# Patient Record
Sex: Female | Born: 1985
Health system: Southern US, Community
[De-identification: ages and names within clinical notes are randomized; demographics above are authoritative.]

## PROBLEM LIST (undated history)

## (undated) ENCOUNTER — Ambulatory Visit: Admission: EM | Payer: BC Managed Care – PPO

## (undated) DIAGNOSIS — L509 Urticaria, unspecified: Secondary | ICD-10-CM

## (undated) DIAGNOSIS — I10 Essential (primary) hypertension: Secondary | ICD-10-CM

## (undated) HISTORY — DX: Urticaria, unspecified: L50.9

---
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
## 2006-03-18 ENCOUNTER — Emergency Department (HOSPITAL_COMMUNITY): Admission: EM | Admit: 2006-03-18 | Discharge: 2006-03-18 | Payer: Self-pay | Admitting: Emergency Medicine

## 2006-03-25 ENCOUNTER — Emergency Department (HOSPITAL_COMMUNITY): Admission: EM | Admit: 2006-03-25 | Discharge: 2006-03-25 | Payer: Self-pay | Admitting: Emergency Medicine

## 2007-01-21 ENCOUNTER — Emergency Department (HOSPITAL_COMMUNITY): Admission: EM | Admit: 2007-01-21 | Discharge: 2007-01-21 | Payer: Self-pay | Admitting: Emergency Medicine

## 2007-01-23 ENCOUNTER — Emergency Department (HOSPITAL_COMMUNITY): Admission: EM | Admit: 2007-01-23 | Discharge: 2007-01-23 | Payer: Self-pay | Admitting: Emergency Medicine

## 2007-01-25 ENCOUNTER — Emergency Department (HOSPITAL_COMMUNITY): Admission: EM | Admit: 2007-01-25 | Discharge: 2007-01-25 | Payer: Self-pay | Admitting: Emergency Medicine

## 2009-04-10 ENCOUNTER — Emergency Department (HOSPITAL_COMMUNITY): Admission: EM | Admit: 2009-04-10 | Discharge: 2009-04-10 | Payer: Self-pay | Admitting: Emergency Medicine

## 2009-06-01 ENCOUNTER — Emergency Department (HOSPITAL_COMMUNITY): Admission: EM | Admit: 2009-06-01 | Discharge: 2009-06-01 | Payer: Self-pay | Admitting: Emergency Medicine

## 2010-07-01 ENCOUNTER — Emergency Department (HOSPITAL_COMMUNITY)
Admission: EM | Admit: 2010-07-01 | Discharge: 2010-07-01 | Disposition: A | Discharge status: Home / Self Care | Payer: Self-pay | Attending: Emergency Medicine | Admitting: Emergency Medicine

## 2010-07-01 DIAGNOSIS — W57XXXA Bitten or stung by nonvenomous insect and other nonvenomous arthropods, initial encounter: Secondary | ICD-10-CM | POA: Insufficient documentation

## 2010-07-01 DIAGNOSIS — S30860A Insect bite (nonvenomous) of lower back and pelvis, initial encounter: Secondary | ICD-10-CM | POA: Insufficient documentation

## 2010-07-09 LAB — RAPID STREP SCREEN (MED CTR MEBANE ONLY): Streptococcus, Group A Screen (Direct): POSITIVE — AB

## 2010-09-04 NOTE — Discharge Summary (Signed)
[facility]  Patient:    Kristi Bowen, Kristi Bowen Visit Number: [id number] MRN: [id number]          Service Type: OBS Location: [id number] Attending Physician:  Michaele Offer Dictated by:   Zenaida Niece, M.D. Admit Date:  05/04/2001 Discharge Date: 05/06/2001                             Discharge Summary  ADMISSION DIAGNOSES:          1. Term pregnancy.                               2. No prenatal care.  DISCHARGE DIAGNOSES:          1. Term pregnancy.                               2. No prenatal care.  PROCEDURE:                    Spontaneous vaginal delivery.  COMPLICATIONS:                None.  CONSULTATIONS:                None.  HISTORY OF PRESENT ILLNESS:   This is a 25 year old black female, gravida 1, para 0, with an unknown EGA who had no prenatal care.  She presented to [facility] Emergency Department on May 03, 2001, or early on May 04, 2001, complaining of abdominal pains.  At that time she did not know she was pregnant.  Exam there eventually revealed a term-sized fundus and that she was 9 cm dilated, completely effaced, and had a +2 station.  PAST MEDICAL HISTORY:         Patient denies.  PAST SURGICAL HISTORY:        Patient denies.  MEDICATIONS:                  Aspirin and Midol for abdominal pains.  SOCIAL HISTORY:               Single teenager in school, and she reports coitus only one time.  GYNECOLOGICAL HISTORY:        No prior GYN exam.  PHYSICAL EXAMINATION:  GENERAL:                      Obese black female.  VITAL SIGNS:                  Afebrile.  Stable vital signs.  ABDOMEN:                      Gravid.  Estimated fetal weight is 7 pounds.  PELVIC:                       On my first exam was complete, complete, and +2.  HOSPITAL COURSE:              The patient pushed for approximately 45 minutes in the ED and had a vaginal delivery of a viable female infant with Apgars of 9 and 9  who weighed 7 pounds 9 ounces.  The patient delivered with local block.  Placenta delivered spontaneously and was intact.  Fundus  was firm, and her bleeding was controlled, and she was transferred to [facility] for better exposure and repair of lacerations.  At [facility] her bleeding was stable, her fundus was firm at U-1.  She had a second-degree perineal and vaginal laceration which was repaired with 3-0 Vicryl with local block, and estimated blood loss was approximately 500 cc.  Postpartum she did very well. She had a social service consult due to her unusual situation.  She remained afebrile and bottle-fed her baby.  Predelivery hemoglobin was 11.1, postdelivery was 9.5.  Other laboratories drawn revealed a negative hepatitis B surface antigen, blood type A positive, RPR nonreactive, rubella is immune. On the morning of postpartum day #2 the patient was felt to be stable enough for discharge home.  CONDITION ON DISCHARGE:       Stable.  DISPOSITION:                  Discharged to home.  DIET:                         Regular.  ACTIVITY:                     Pelvic rest.  FOLLOW-UP:                    In four to six weeks.  MEDICATIONS:                  Over-the-counter Motrin orally p.r.n.  DISCHARGE INSTRUCTIONS:       She was given our discharge pamphlet. Dictated by:   Zenaida Niece, M.D. Attending Physician:  Michaele Offer DD:  05/08/01 TD:  05/08/01 Job: [phone] [id number]

## 2011-01-28 LAB — CULTURE, ROUTINE-ABSCESS

## 2015-03-29 ENCOUNTER — Emergency Department (HOSPITAL_COMMUNITY)
Admission: EM | Admit: 2015-03-29 | Discharge: 2015-03-29 | Disposition: A | Discharge status: Home / Self Care | Payer: Medicaid Other | Attending: Emergency Medicine | Admitting: Emergency Medicine

## 2015-03-29 ENCOUNTER — Emergency Department (HOSPITAL_COMMUNITY): Payer: Medicaid Other

## 2015-03-29 ENCOUNTER — Encounter (HOSPITAL_COMMUNITY): Payer: Self-pay

## 2015-03-29 DIAGNOSIS — Z3202 Encounter for pregnancy test, result negative: Secondary | ICD-10-CM | POA: Insufficient documentation

## 2015-03-29 DIAGNOSIS — J069 Acute upper respiratory infection, unspecified: Secondary | ICD-10-CM

## 2015-03-29 DIAGNOSIS — I10 Essential (primary) hypertension: Secondary | ICD-10-CM | POA: Insufficient documentation

## 2015-03-29 DIAGNOSIS — E669 Obesity, unspecified: Secondary | ICD-10-CM | POA: Insufficient documentation

## 2015-03-29 DIAGNOSIS — R1110 Vomiting, unspecified: Secondary | ICD-10-CM | POA: Insufficient documentation

## 2015-03-29 DIAGNOSIS — I517 Cardiomegaly: Secondary | ICD-10-CM | POA: Insufficient documentation

## 2015-03-29 DIAGNOSIS — F17200 Nicotine dependence, unspecified, uncomplicated: Secondary | ICD-10-CM | POA: Insufficient documentation

## 2015-03-29 DIAGNOSIS — N938 Other specified abnormal uterine and vaginal bleeding: Secondary | ICD-10-CM | POA: Insufficient documentation

## 2015-03-29 LAB — CBC WITH DIFFERENTIAL/PLATELET
BASOS ABS: 0 10*3/uL (ref 0.0–0.1)
Basophils Relative: 0 %
EOS ABS: 0.2 10*3/uL (ref 0.0–0.7)
EOS PCT: 1 %
HCT: 39.2 % (ref 36.0–46.0)
Hemoglobin: 12.6 g/dL (ref 12.0–15.0)
LYMPHS ABS: 3.5 10*3/uL (ref 0.7–4.0)
Lymphocytes Relative: 27 %
MCH: 27.6 pg (ref 26.0–34.0)
MCHC: 32.1 g/dL (ref 30.0–36.0)
MCV: 85.8 fL (ref 78.0–100.0)
Monocytes Absolute: 0.8 10*3/uL (ref 0.1–1.0)
Monocytes Relative: 6 %
Neutro Abs: 8.5 10*3/uL — ABNORMAL HIGH (ref 1.7–7.7)
Neutrophils Relative %: 66 %
PLATELETS: 372 10*3/uL (ref 150–400)
RBC: 4.57 MIL/uL (ref 3.87–5.11)
RDW: 13.6 % (ref 11.5–15.5)
WBC: 13 10*3/uL — AB (ref 4.0–10.5)

## 2015-03-29 LAB — COMPREHENSIVE METABOLIC PANEL
ALT: 13 U/L — AB (ref 14–54)
AST: 19 U/L (ref 15–41)
Albumin: 3.8 g/dL (ref 3.5–5.0)
Alkaline Phosphatase: 50 U/L (ref 38–126)
Anion gap: 7 (ref 5–15)
BUN: 11 mg/dL (ref 6–20)
CHLORIDE: 103 mmol/L (ref 101–111)
CO2: 25 mmol/L (ref 22–32)
CREATININE: 0.98 mg/dL (ref 0.44–1.00)
Calcium: 8.7 mg/dL — ABNORMAL LOW (ref 8.9–10.3)
GFR calc non Af Amer: >60 mL/min (ref >60)
Glucose, Bld: 117 mg/dL — ABNORMAL HIGH (ref 65–99)
Potassium: 3.7 mmol/L (ref 3.5–5.1)
SODIUM: 135 mmol/L (ref 135–145)
Total Bilirubin: 0.6 mg/dL (ref 0.3–1.2)
Total Protein: 7.3 g/dL (ref 6.5–8.1)

## 2015-03-29 LAB — POC URINE PREG, ED: Preg Test, Ur: NEGATIVE

## 2015-03-29 MED ORDER — ONDANSETRON HCL 4 MG/2ML IJ SOLN
4.0000 mg | Freq: Once | INTRAMUSCULAR | Status: DC
  Filled 2015-03-29: qty 2

## 2015-03-29 MED ORDER — BENZONATATE 100 MG PO CAPS
100.0000 mg | ORAL_CAPSULE | Freq: Once | ORAL | Status: AC
  Administered 2015-03-29: 100 mg via ORAL
  Filled 2015-03-29: qty 1

## 2015-03-29 MED ORDER — AMLODIPINE BESYLATE 10 MG PO TABS
10.0000 mg | ORAL_TABLET | Freq: Once | ORAL | Status: AC
  Administered 2015-03-29: 10 mg via ORAL
  Filled 2015-03-29: qty 1

## 2015-03-29 MED ORDER — BENZONATATE 100 MG PO CAPS: 100.0000 mg | ORAL_CAPSULE | Freq: Three times a day (TID) | ORAL | Status: DC

## 2015-03-29 MED ORDER — AMLODIPINE BESYLATE 10 MG PO TABS: 10.0000 mg | ORAL_TABLET | Freq: Every day | ORAL | Status: DC

## 2015-03-29 NOTE — ED Notes (Signed)
INITIAL ASSESSMENT COMPLETED. PT C/O IRREGULAR VAGINAL SPOTTING SINCE X1 MONTH. UPON ARRIVAL, PT'S BP WAS ALSO ELEVATED WITH NO HX OF HTN. PT DENIES HEADACHE, ABDOMINAL PAIN, OR N/V/D.  AWAITING FURTHER ORDERS.

## 2015-03-29 NOTE — ED Notes (Addendum)
Pt presents with c/o vaginal bleeding and vomiting. Pt reports her period starting on 11/15 and has not stopped since that day. Pt also c/o vomiting that started last night. Pt also c/o sore throat from coughing and pain in her chest from coughing.

## 2015-03-29 NOTE — Discharge Instructions (Signed)

## 2015-03-29 NOTE — ED Notes (Signed)
PT DISCHARGED. INSTRUCTIONS AND PRESCRIPTIONS GIVEN. AAOX3. PT IN NO APPARENT DISTRESS OR PAIN. THE OPPORTUNITY TO ASK QUESTIONS WAS PROVIDED. 

## 2015-03-29 NOTE — ED Notes (Signed)
Dr. Billy Fischer made aware of pt's BP. Requested a manual BP be taken. Ysidro Evert EMT made aware of this.

## 2015-03-29 NOTE — ED Provider Notes (Signed)
CSN: [id number]     Arrival date & time 03/29/15  1858 History   First MD Initiated Contact with Patient 03/29/15 1913     Chief Complaint  Patient presents with  . Vaginal Bleeding  . Emesis     (Consider location/radiation/quality/duration/timing/severity/associated sxs/prior Treatment) Patient is a 29 y.o. female presenting with vaginal bleeding, vomiting, and URI.  Vaginal Bleeding Associated symptoms: fatigue   Associated symptoms: no abdominal pain, no back pain, no dyspareunia, no dysuria, no fever, no nausea and no vaginal discharge   Emesis Associated symptoms: sore throat and URI   Associated symptoms: no abdominal pain and no headaches   URI Presenting symptoms: congestion, cough, ear pain (ear itchiness left), fatigue, rhinorrhea and sore throat   Presenting symptoms: no fever   Onset quality:  Gradual Duration:  2 days Timing:  Constant Progression:  Worsening Chronicity:  New Relieved by:  Nothing Worsened by:  Nothing tried Ineffective treatments:  None tried Associated symptoms: no headaches, no neck pain, no swollen glands and no wheezing     History reviewed. No pertinent past medical history. History reviewed. No pertinent past surgical history. No family history on file. Social History  Substance Use Topics  . Smoking status: Current Every Day Smoker  . Smokeless tobacco: None  . Alcohol Use: No   OB History    No data available     Review of Systems  Constitutional: Positive for fatigue. Negative for fever.  HENT: Positive for congestion, ear pain (ear itchiness left), rhinorrhea and sore throat.   Eyes: Negative for visual disturbance.  Respiratory: Positive for cough. Negative for shortness of breath and wheezing.   Cardiovascular: Negative for chest pain.  Gastrointestinal: Positive for vomiting (after coughing, 5 times). Negative for nausea and abdominal pain.  Genitourinary: Positive for vaginal bleeding (since 11/15, decreasing now to 1  pad/day, very light). Negative for dysuria, vaginal discharge, difficulty urinating and dyspareunia.  Musculoskeletal: Negative for back pain and neck pain.  Skin: Negative for rash.  Neurological: Negative for syncope, facial asymmetry, speech difficulty, weakness, numbness and headaches.      Allergies  Review of patient's allergies indicates no known allergies.  Home Medications   Prior to Admission medications   Medication Sig Start Date End Date Taking? Authorizing Provider  guaiFENesin (MUCINEX) 600 MG 12 hr tablet Take 600 mg by mouth 2 (two) times daily as needed for cough or to loosen phlegm.   Yes Historical Provider, MD  amLODipine (NORVASC) 10 MG tablet Take 1 tablet (10 mg total) by mouth daily. 03/29/15   [name], MD  benzonatate (TESSALON) 100 MG capsule Take 1 capsule (100 mg total) by mouth every 8 (eight) hours. 03/29/15   [name], MD   BP 170/95 mmHg  Pulse 75  Temp(Src) 97.6 F (36.4 C) (Oral)  Resp 20  SpO2 99%  LMP 03/29/2015 (Approximate) Physical Exam  Constitutional: She is oriented to person, place, and time. She appears well-developed and well-nourished. No distress.  HENT:  Head: Normocephalic and atraumatic.  Right Ear: Tympanic membrane is not injected.  Left Ear: Tympanic membrane is not injected.  Eyes: Conjunctivae and EOM are normal.  Neck: Normal range of motion.  Cardiovascular: Normal rate, regular rhythm, normal heart sounds and intact distal pulses.  Exam reveals no gallop and no friction rub.   No murmur heard. Pulmonary/Chest: Effort normal and breath sounds normal. No respiratory distress. She has no wheezes. She has no rales.  Abdominal: Soft. She exhibits no distension.  There is no tenderness. There is no guarding.  Musculoskeletal: She exhibits no edema or tenderness.  Neurological: She is alert and oriented to person, place, and time. She has normal strength. She displays no tremor. No cranial nerve deficit or  sensory deficit. Coordination and gait normal. GCS eye subscore is 4. GCS verbal subscore is 5. GCS motor subscore is 6.  Skin: Skin is warm and dry. No rash noted. She is not diaphoretic. No erythema.  Nursing note and vitals reviewed.   ED Course  Procedures (including critical care time) Labs Review Labs Reviewed  CBC WITH DIFFERENTIAL/PLATELET - Abnormal; Notable for the following:    WBC 13.0 (*)    Neutro Abs 8.5 (*)    All other components within normal limits  COMPREHENSIVE METABOLIC PANEL - Abnormal; Notable for the following:    Glucose, Bld 117 (*)    Calcium 8.7 (*)    ALT 13 (*)    All other components within normal limits  POC URINE PREG, ED    Imaging Review Dg Chest 2 View  03/29/2015  CLINICAL DATA:  Mid chest pain, shortness of breath, productive cough for 2 days. EXAM: CHEST  2 VIEW COMPARISON:  04/10/2009 FINDINGS: Progressive cardiomegaly from prior exam. Mediastinal contours are unchanged. No pulmonary edema, confluent airspace disease, pleural effusion or pneumothorax. No acute osseous abnormalities. IMPRESSION: Progressive cardiomegaly.  No localizing pulmonary process. Electronically Signed   By: Jeb Levering M.D.   On: 03/29/2015 20:37   I have personally reviewed and evaluated these images and lab results as part of my medical decision-making.   EKG Interpretation None      MDM   Final diagnoses:  URI (upper respiratory infection)  Essential hypertension  Cardiomegaly   29yo female with history of obesity with no PCP and no other known medical history presents with concern for cough, nasal congestion, sore throat.  Patient reports coughing to the point of emesis, with cough beginning yesterday. Do not feel symptom window is consistent with pertussis.  CXR done showing no sign of pneumonia.  Doubt epiglottitis, RPA. No fever or exudate to suggest strep throat.  No abd pain/tenderness/urinary symptoms and emesis is with coughing.  Pt blood pressure  elevated to [id number] systolic with no prior known hx of HBP. Patient without headache, no neurologic symptoms, no chest pain, no shortness of breath and have low suspicion for hypertensive emergencies including low suspicion for [facility], hypertensive encephalopathy, stroke, MI, aortic dissection, pulmonary edema.  BMP was checked showing normal renal function.  Gave pt 10mg  of amlodipine with improvement to [id number] systolic.  Discussed importance of close primary care follow up, diet changes, exercise, taking blood pressure medication (given rx for amlodipine)  and reasons to return to the ED in detail. Patient discharged in stable condition with understanding of reasons to return.      Pt also with vaginal bleeding which has improved significantly--she has normal hgb, mild bleeding at this time, recommend close follow up with PCP regarding underlying etiology for abnormal menses. Pregnancy test negative.    [name], MD 03/30/15 [phone]

## 2015-08-02 ENCOUNTER — Emergency Department (HOSPITAL_COMMUNITY)
Admission: EM | Admit: 2015-08-02 | Discharge: 2015-08-03 | Disposition: A | Discharge status: Home / Self Care | Payer: BLUE CROSS/BLUE SHIELD | Attending: Emergency Medicine | Admitting: Emergency Medicine

## 2015-08-02 ENCOUNTER — Encounter (HOSPITAL_COMMUNITY): Payer: Self-pay | Admitting: Emergency Medicine

## 2015-08-02 ENCOUNTER — Emergency Department (HOSPITAL_COMMUNITY): Payer: BLUE CROSS/BLUE SHIELD

## 2015-08-02 DIAGNOSIS — F17200 Nicotine dependence, unspecified, uncomplicated: Secondary | ICD-10-CM | POA: Insufficient documentation

## 2015-08-02 DIAGNOSIS — J029 Acute pharyngitis, unspecified: Secondary | ICD-10-CM | POA: Diagnosis present

## 2015-08-02 DIAGNOSIS — I10 Essential (primary) hypertension: Secondary | ICD-10-CM

## 2015-08-02 DIAGNOSIS — R600 Localized edema: Secondary | ICD-10-CM | POA: Insufficient documentation

## 2015-08-02 DIAGNOSIS — J069 Acute upper respiratory infection, unspecified: Secondary | ICD-10-CM | POA: Diagnosis not present

## 2015-08-02 DIAGNOSIS — Z79899 Other long term (current) drug therapy: Secondary | ICD-10-CM | POA: Diagnosis not present

## 2015-08-02 HISTORY — DX: Essential (primary) hypertension: I10

## 2015-08-02 LAB — BASIC METABOLIC PANEL
Anion gap: 8 (ref 5–15)
BUN: 15 mg/dL (ref 6–20)
CHLORIDE: 105 mmol/L (ref 101–111)
CO2: 25 mmol/L (ref 22–32)
Calcium: 9.2 mg/dL (ref 8.9–10.3)
Creatinine, Ser: 0.81 mg/dL (ref 0.44–1.00)
GFR calc non Af Amer: >60 mL/min (ref >60)
Glucose, Bld: 110 mg/dL — ABNORMAL HIGH (ref 65–99)
POTASSIUM: 3.7 mmol/L (ref 3.5–5.1)
SODIUM: 138 mmol/L (ref 135–145)

## 2015-08-02 LAB — CBC
HEMATOCRIT: 38.3 % (ref 36.0–46.0)
Hemoglobin: 12.6 g/dL (ref 12.0–15.0)
MCH: 27 pg (ref 26.0–34.0)
MCHC: 32.9 g/dL (ref 30.0–36.0)
MCV: 82.2 fL (ref 78.0–100.0)
Platelets: 393 10*3/uL (ref 150–400)
RBC: 4.66 MIL/uL (ref 3.87–5.11)
RDW: 13.5 % (ref 11.5–15.5)
WBC: 13.3 10*3/uL — AB (ref 4.0–10.5)

## 2015-08-02 LAB — I-STAT TROPONIN, ED: Troponin i, poc: 0.02 ng/mL (ref 0.00–0.08)

## 2015-08-02 MED ORDER — ALBUTEROL SULFATE HFA 108 (90 BASE) MCG/ACT IN AERS
2.0000 | INHALATION_SPRAY | Freq: Once | RESPIRATORY_TRACT | Status: AC
  Administered 2015-08-03: 2 via RESPIRATORY_TRACT
  Filled 2015-08-02: qty 6.7

## 2015-08-02 MED ORDER — PREDNISONE 20 MG PO TABS
60.0000 mg | ORAL_TABLET | Freq: Once | ORAL | Status: AC
  Administered 2015-08-03: 60 mg via ORAL
  Filled 2015-08-02: qty 3

## 2015-08-02 NOTE — ED Notes (Addendum)
Pt here from home with c/o sore throat, cough, and emesis x 2 days. Pt states she has chest wall pain when she takes a deep breath or coughs. Pt states she has had a nonproductive cough, but had emesis that was clear.  Pt was given a rx for amlodipine, but didn't have insurance so she did not get it filled.

## 2015-08-02 NOTE — ED Provider Notes (Signed)
CSN: [id number]     Arrival date & time 08/02/15  2145 History  By signing my name below, I, Kristi Bowen, attest that this documentation has been prepared under the direction and in the presence of [name], MD. Electronically Signed: Hansel Bowen, ED Scribe. 08/02/2015. 12:44 AM.    Chief Complaint  Patient presents with  . Cough  . Sore Throat  . Emesis  . Chest Pain   The history is provided by the patient. No language interpreter was used.   HPI Comments: Kristi Bowen is a 30 y.o. female with h/o HTN (not currently taking amlodipine) who presents to the Emergency Department complaining of moderate, intermittent productive cough onset 2 days ago with associated fever (Tmax 101), sore throat, congestion, one episode of emesis. She also complains of chest tenderness secondary to coughing. No known sick contacts with similar symptoms. No daily medications. Pt is a current smoker. Denies SOB, abdominal pain, nausea, itchy eyes, rhinorrhea, diarrhea, leg swelling.   Past Medical History  Diagnosis Date  . Hypertension    History reviewed. No pertinent past surgical history. No family history on file. Social History  Substance Use Topics  . Smoking status: Current Every Day Smoker  . Smokeless tobacco: None  . Alcohol Use: No   OB History    No data available     Review of Systems  Constitutional: Positive for fever.  HENT: Positive for congestion and sore throat. Negative for rhinorrhea.   Eyes: Negative for itching.  Respiratory: Positive for cough. Negative for shortness of breath.   Cardiovascular: Positive for chest pain ( secondary to cough). Negative for leg swelling.  Gastrointestinal: Positive for vomiting. Negative for nausea, abdominal pain and diarrhea.  All other systems reviewed and are negative.  Allergies  Review of patient's allergies indicates no known allergies.  Home Medications   Prior to Admission medications   Medication Sig Start Date  End Date Taking? Authorizing Provider  amLODipine (NORVASC) 10 MG tablet Take 1 tablet (10 mg total) by mouth daily. 03/29/15  Yes [name], MD  Pseudoeph-Doxylamine-DM-APAP (NYQUIL PO) Take 5 mLs by mouth daily as needed (cold).   Yes Historical Provider, MD  albuterol (PROVENTIL HFA;VENTOLIN HFA) 108 (90 Base) MCG/ACT inhaler Inhale 2 puffs into the lungs every 4 (four) hours as needed for wheezing or shortness of breath. 08/03/15   [name], MD  benzonatate (TESSALON) 100 MG capsule Take 1 capsule (100 mg total) by mouth every 8 (eight) hours. Patient not taking: Reported on 08/02/2015 03/29/15   [name], MD  fluticasone [facility]) 50 MCG/ACT nasal spray Place 2 sprays into both nostrils daily. 08/03/15   [name], MD  predniSONE (DELTASONE) 20 MG tablet Take 3 tablets (60 mg total) by mouth daily with breakfast. 08/03/15   [name], MD   BP 195/122 mmHg  Pulse 91  Temp(Src) 98.3 F (36.8 C) (Oral)  Resp 16  Ht 5\' 4"  (1.626 m)  Wt 389 lb (176.449 kg)  BMI 66.74 kg/m2  SpO2 99%  LMP 07/27/2015 Physical Exam  Constitutional: She is oriented to person, place, and time. She appears well-developed and well-nourished.  Morbidly obese, no acute distress  HENT:  Head: Normocephalic and atraumatic.  Mouth/Throat: Oropharynx is clear and moist. No oropharyngeal exudate.  Cardiovascular: Normal rate, regular rhythm and normal heart sounds.   No murmur heard. Pulmonary/Chest: Effort normal. No respiratory distress. She has wheezes.  Expiratory wheezing  Abdominal: Soft. Bowel sounds are normal.  There is no tenderness. There is no rebound.  Musculoskeletal: She exhibits edema.  Trace bilateral lower extremity edema  Neurological: She is alert and oriented to person, place, and time.  Skin: Skin is warm and dry.  Psychiatric: She has a normal mood and affect.  Nursing note and vitals reviewed.   ED Course  Procedures (including critical care  time) DIAGNOSTIC STUDIES: Oxygen Saturation is 99% on RA, normal by my interpretation.    COORDINATION OF CARE: 11:22 PM Discussed treatment plan with pt at bedside which includes lab work, CXR and pt agreed to plan.   Labs Review Labs Reviewed  BASIC METABOLIC PANEL - Abnormal; Notable for the following:    Glucose, Bld 110 (*)    All other components within normal limits  CBC - Abnormal; Notable for the following:    WBC 13.3 (*)    All other components within normal limits  RAPID STREP SCREEN (NOT AT [facility])  CULTURE, GROUP A STREP [facility])  Randolm Idol, ED    Imaging Review Dg Chest 2 View  08/02/2015  CLINICAL DATA:  Central pleuritic chest pain and cough for 2 days. EXAM: CHEST  2 VIEW COMPARISON:  03/29/2015 FINDINGS: There is marked cardiomegaly, unchanged. The lungs are clear. There are no pleural effusions. The pulmonary vasculature is normal. Hilar and mediastinal contours are unremarkable and unchanged. IMPRESSION: Marked cardiomegaly. No significant interval change from 03/29/2015. Electronically Signed   By: [name] M.D.   On: 08/02/2015 23:46   I have personally reviewed and evaluated these images and lab results as part of my medical decision-making.   EKG Interpretation   Date/Time:  Saturday August 02 2015 23:39:32 EDT Ventricular Rate:  74 PR Interval:  149 QRS Duration: 89 QT Interval:  428 QTC Calculation: 475 R Axis:   35 Text Interpretation:  Sinus rhythm Confirmed by [name]  MD, Warren  [id number]) on 08/03/2015 12:38:35 AM      MDM   Final diagnoses:  URI (upper respiratory infection)  Essential hypertension    Patient presents with upper respiratory symptoms. Nontoxic on exam. She was hypertensive. Known history of hypertension. Not currently taking amlodipine. She currently states that she now has insurance and can't afford her medications. I've encouraged her to start taking her medications. She has no evidence of end organ damage or  hypertensive urgency/emergency. She's got some wheezing and upper respiratory complaints consistent with viral URI. Chest x-ray without pneumonia. Will discharge him with supportive measures including prednisone, inhaler, Flonase.  After history, exam, and medical workup I feel the patient has been appropriately medically screened and is safe for discharge home. Pertinent diagnoses were discussed with the patient. Patient was given return precautions.  I personally performed the services described in this documentation, which was scribed in my presence. The recorded information has been reviewed and is accurate.   [name], MD 08/03/15 [phone]

## 2015-08-03 LAB — RAPID STREP SCREEN (MED CTR MEBANE ONLY): Streptococcus, Group A Screen (Direct): NEGATIVE

## 2015-08-03 MED ORDER — PREDNISONE 20 MG PO TABS: 60.0000 mg | ORAL_TABLET | Freq: Every day | ORAL | Status: DC

## 2015-08-03 MED ORDER — FLUTICASONE PROPIONATE 50 MCG/ACT NA SUSP: 2.0000 | Freq: Every day | NASAL | Status: DC

## 2015-08-03 MED ORDER — ALBUTEROL SULFATE HFA 108 (90 BASE) MCG/ACT IN AERS: 2.0000 | INHALATION_SPRAY | RESPIRATORY_TRACT | Status: DC | PRN

## 2015-08-03 NOTE — Discharge Instructions (Signed)
Upper Respiratory Infection, Adult Most upper respiratory infections (URIs) are a viral infection of the air passages leading to the lungs. A URI affects the nose, throat, and upper air passages. The most common type of URI is nasopharyngitis and is typically referred to as "the common cold." URIs run their course and usually go away on their own. Most of the time, a URI does not require medical attention, but sometimes a bacterial infection in the upper airways can follow a viral infection. This is called a secondary infection. Sinus and middle ear infections are common types of secondary upper respiratory infections. Bacterial pneumonia can also complicate a URI. A URI can worsen asthma and chronic obstructive pulmonary disease (COPD). Sometimes, these complications can require emergency medical care and may be life threatening.  CAUSES Almost all URIs are caused by viruses. A virus is a type of germ and can spread from one person to another.  RISKS FACTORS You may be at risk for a URI if:   You smoke.   You have chronic heart or lung disease.  You have a weakened defense (immune) system.   You are very young or very old.   You have nasal allergies or asthma.  You work in crowded or poorly ventilated areas.  You work in health care facilities or schools. SIGNS AND SYMPTOMS  Symptoms typically develop 2-3 days after you come in contact with a cold virus. Most viral URIs last 7-10 days. However, viral URIs from the influenza virus (flu virus) can last 14-18 days and are typically more severe. Symptoms may include:   Runny or stuffy (congested) nose.   Sneezing.   Cough.   Sore throat.   Headache.   Fatigue.   Fever.   Loss of appetite.   Pain in your forehead, behind your eyes, and over your cheekbones (sinus pain).  Muscle aches.  DIAGNOSIS  Your health care provider may diagnose a URI by:  Physical exam.  Tests to check that your symptoms are not due to  another condition such as:  Strep throat.  Sinusitis.  Pneumonia.  Asthma. TREATMENT  A URI goes away on its own with time. It cannot be cured with medicines, but medicines may be prescribed or recommended to relieve symptoms. Medicines may help:  Reduce your fever.  Reduce your cough.  Relieve nasal congestion. HOME CARE INSTRUCTIONS   Take medicines only as directed by your health care provider.   Gargle warm saltwater or take cough drops to comfort your throat as directed by your health care provider.  Use a warm mist humidifier or inhale steam from a shower to increase air moisture. This may make it easier to breathe.  Drink enough fluid to keep your urine clear or pale yellow.   Eat soups and other clear broths and maintain good nutrition.   Rest as needed.   Return to work when your temperature has returned to normal or as your health care provider advises. You may need to stay home longer to avoid infecting others. You can also use a face mask and careful hand washing to prevent spread of the virus.  Increase the usage of your inhaler if you have asthma.   Do not use any tobacco products, including cigarettes, chewing tobacco, or electronic cigarettes. If you need help quitting, ask your health care provider. PREVENTION  The best way to protect yourself from getting a cold is to practice good hygiene.   Avoid oral or hand contact with people with cold  symptoms.   Wash your hands often if contact occurs.  There is no clear evidence that vitamin C, vitamin E, echinacea, or exercise reduces the chance of developing a cold. However, it is always recommended to get plenty of rest, exercise, and practice good nutrition.  SEEK MEDICAL CARE IF:   You are getting worse rather than better.   Your symptoms are not controlled by medicine.   You have chills.  You have worsening shortness of breath.  You have brown or red mucus.  You have yellow or brown nasal  discharge.  You have pain in your face, especially when you bend forward.  You have a fever.  You have swollen neck glands.  You have pain while swallowing.  You have white areas in the back of your throat. SEEK IMMEDIATE MEDICAL CARE IF:   You have severe or persistent:  Headache.  Ear pain.  Sinus pain.  Chest pain.  You have chronic lung disease and any of the following:  Wheezing.  Prolonged cough.  Coughing up blood.  A change in your usual mucus.  You have a stiff neck.  You have changes in your:  Vision.  Hearing.  Thinking.  Mood. MAKE SURE YOU:   Understand these instructions.  Will watch your condition.  Will get help right away if you are not doing well or get worse.   This information is not intended to replace advice given to you by your health care provider. Make sure you discuss any questions you have with your health care provider.   Document Released: 09/29/2000 Document Revised: 08/20/2014 Document Reviewed: 07/11/2013 Elsevier Interactive Patient Education 2016 [employer]. Hypertension Hypertension, commonly called high blood pressure, is when the force of blood pumping through your arteries is too strong. Your arteries are the blood vessels that carry blood from your heart throughout your body. A blood pressure reading consists of a higher number over a lower number, such as 110/72. The higher number (systolic) is the pressure inside your arteries when your heart pumps. The lower number (diastolic) is the pressure inside your arteries when your heart relaxes. Ideally you want your blood pressure below 120/80. Hypertension forces your heart to work harder to pump blood. Your arteries may become narrow or stiff. Having untreated or uncontrolled hypertension can cause heart attack, stroke, kidney disease, and other problems. RISK FACTORS Some risk factors for high blood pressure are controllable. Others are not.  Risk factors you cannot  control include:   Race. You may be at higher risk if you are African American.  Age. Risk increases with age.  Gender. Men are at higher risk than women before age 67 years. After age 42, women are at higher risk than men. Risk factors you can control include:  Not getting enough exercise or physical activity.  Being overweight.  Getting too much fat, sugar, calories, or salt in your diet.  Drinking too much alcohol. SIGNS AND SYMPTOMS Hypertension does not usually cause signs or symptoms. Extremely high blood pressure (hypertensive crisis) may cause headache, anxiety, shortness of breath, and nosebleed. DIAGNOSIS To check if you have hypertension, your health care provider will measure your blood pressure while you are seated, with your arm held at the level of your heart. It should be measured at least twice using the same arm. Certain conditions can cause a difference in blood pressure between your right and left arms. A blood pressure reading that is higher than normal on one occasion does not mean that  you need treatment. If it is not clear whether you have high blood pressure, you may be asked to return on a different day to have your blood pressure checked again. Or, you may be asked to monitor your blood pressure at home for 1 or more weeks. TREATMENT Treating high blood pressure includes making lifestyle changes and possibly taking medicine. Living a healthy lifestyle can help lower high blood pressure. You may need to change some of your habits. Lifestyle changes may include:  Following the DASH diet. This diet is high in fruits, vegetables, and whole grains. It is low in salt, red meat, and added sugars.  Keep your sodium intake below 2,300 mg per day.  Getting at least 30-45 minutes of aerobic exercise at least 4 times per week.  Losing weight if necessary.  Not smoking.  Limiting alcoholic beverages.  Learning ways to reduce stress. Your health care provider may  prescribe medicine if lifestyle changes are not enough to get your blood pressure under control, and if one of the following is true:  You are 10-64 years of age and your systolic blood pressure is above 140.  You are 53 years of age or older, and your systolic blood pressure is above 150.  Your diastolic blood pressure is above 90.  You have diabetes, and your systolic blood pressure is over [id number] or your diastolic blood pressure is over 90.  You have kidney disease and your blood pressure is above 140/90.  You have heart disease and your blood pressure is above 140/90. Your personal target blood pressure may vary depending on your medical conditions, your age, and other factors. HOME CARE INSTRUCTIONS  Have your blood pressure rechecked as directed by your health care provider.   Take medicines only as directed by your health care provider. Follow the directions carefully. Blood pressure medicines must be taken as prescribed. The medicine does not work as well when you skip doses. Skipping doses also puts you at risk for problems.  Do not smoke.   Monitor your blood pressure at home as directed by your health care provider. SEEK MEDICAL CARE IF:   You think you are having a reaction to medicines taken.  You have recurrent headaches or feel dizzy.  You have swelling in your ankles.  You have trouble with your vision. SEEK IMMEDIATE MEDICAL CARE IF:  You develop a severe headache or confusion.  You have unusual weakness, numbness, or feel faint.  You have severe chest or abdominal pain.  You vomit repeatedly.  You have trouble breathing. MAKE SURE YOU:   Understand these instructions.  Will watch your condition.  Will get help right away if you are not doing well or get worse.   This information is not intended to replace advice given to you by your health care provider. Make sure you discuss any questions you have with your health care provider.   Document  Released: 04/05/2005 Document Revised: 08/20/2014 Document Reviewed: 01/26/2013 Elsevier Interactive Patient Education [employer].

## 2015-08-05 LAB — CULTURE, GROUP A STREP (THRC)

## 2016-01-15 ENCOUNTER — Emergency Department (HOSPITAL_COMMUNITY)
Admission: EM | Admit: 2016-01-15 | Discharge: 2016-01-16 | Disposition: A | Discharge status: Home / Self Care | Payer: BLUE CROSS/BLUE SHIELD | Attending: Emergency Medicine | Admitting: Emergency Medicine

## 2016-01-15 ENCOUNTER — Encounter (HOSPITAL_COMMUNITY): Payer: Self-pay

## 2016-01-15 DIAGNOSIS — A5903 Trichomonal cystitis and urethritis: Secondary | ICD-10-CM | POA: Diagnosis not present

## 2016-01-15 DIAGNOSIS — Z6841 Body Mass Index (BMI) 40.0 and over, adult: Secondary | ICD-10-CM | POA: Diagnosis not present

## 2016-01-15 DIAGNOSIS — N201 Calculus of ureter: Secondary | ICD-10-CM | POA: Insufficient documentation

## 2016-01-15 DIAGNOSIS — I10 Essential (primary) hypertension: Secondary | ICD-10-CM | POA: Diagnosis not present

## 2016-01-15 DIAGNOSIS — N838 Other noninflammatory disorders of ovary, fallopian tube and broad ligament: Secondary | ICD-10-CM | POA: Insufficient documentation

## 2016-01-15 DIAGNOSIS — R935 Abnormal findings on diagnostic imaging of other abdominal regions, including retroperitoneum: Secondary | ICD-10-CM

## 2016-01-15 DIAGNOSIS — F17200 Nicotine dependence, unspecified, uncomplicated: Secondary | ICD-10-CM | POA: Diagnosis not present

## 2016-01-15 DIAGNOSIS — R1031 Right lower quadrant pain: Secondary | ICD-10-CM | POA: Diagnosis present

## 2016-01-15 DIAGNOSIS — N83209 Unspecified ovarian cyst, unspecified side: Secondary | ICD-10-CM

## 2016-01-15 LAB — URINALYSIS, ROUTINE W REFLEX MICROSCOPIC
Bilirubin Urine: NEGATIVE
Glucose, UA: NEGATIVE mg/dL
Ketones, ur: NEGATIVE mg/dL
Nitrite: NEGATIVE
Protein, ur: NEGATIVE mg/dL
Specific Gravity, Urine: 1.008 (ref 1.005–1.030)
pH: 7 (ref 5.0–8.0)

## 2016-01-15 LAB — URINE MICROSCOPIC-ADD ON

## 2016-01-15 NOTE — ED Triage Notes (Signed)
Pt c/o abnormal menses for the past x12 days. Pt also c/o right flank pain that radiates to her right groin. Pt denies dysuria and polyuria. Pt A+OX4.

## 2016-01-15 NOTE — ED Provider Notes (Signed)
[facility] DEPT Provider Note: Georgena Spurling, MD, FACEP  CSN: [id number] MRN: [id number] ARRIVAL: 01/15/16 at [place]  Kristi Bowen is a 30 y.o. female with complaints R flank radiating to RLQ x6 days. Pt describes the pain as sharp and constant. It is not significantly changed with movement or palpation. The pain is made it difficult to sleep. Pt reports that she has been on her period for 12 days and that her normal periods are typically 4 days. Her flow had been heavy but is slowing down now. It is now less than usual menstrual bleeding. She denies nausea, vomiting, dysuria, hematuria and vaginal discharge   Past Medical History:  Diagnosis Date  . Hypertension     History reviewed. No pertinent surgical history.  History reviewed. No pertinent family history.  Social History  Substance Use Topics  . Smoking status: Current Every Day Smoker  . Smokeless tobacco: Not on file  . Alcohol use No    Prior to Admission medications   Medication Sig Start Date End Date Taking? Authorizing Provider  albuterol (PROVENTIL HFA;VENTOLIN HFA) 108 (90 Base) MCG/ACT inhaler Inhale 2 puffs into the lungs every 4 (four) hours as needed for wheezing or shortness of breath. 08/03/15  Yes [name], MD  valsartan-hydrochlorothiazide (DIOVAN-HCT) 160-12.5 MG tablet Take 1 tablet by mouth daily. 01/09/16  Yes Historical Provider, MD  ondansetron (ZOFRAN ODT) 8 MG disintegrating tablet Take 1 tablet (8 mg total) by mouth every 8 (eight) hours as needed for nausea or vomiting. 01/16/16   [name], MD  oxyCODONE-acetaminophen (PERCOCET) 5-325 MG tablet Take 1-2 tablets by mouth every 6 (six) hours as needed (for pain). 01/16/16   Chasiti Waddington, MD  tamsulosin (FLOMAX) 0.4 MG CAPS capsule Take one capsule daily until stone passes. 01/16/16   [name], MD    Allergies Review of patient's allergies indicates no known  allergies.   REVIEW OF SYSTEMS  Negative except as noted here or in the History of Present Illness.   PHYSICAL EXAMINATION  Initial Vital Signs Blood pressure (!) 210/135, pulse 86, temperature 99.3 F (37.4 C), temperature source Oral, resp. rate 20, height 5\' 3"  (1.6 m), SpO2 100 %.  Examination General: Well-developed, morbidly obese female in no acute distress; appearance consistent with age of record HENT: normocephalic; atraumatic Eyes: pupils equal, round and reactive to light; extraocular muscles intact Neck: supple Heart: regular rate and rhythm; no murmurs, rubs or gallops Lungs: clear to auscultation bilaterally Abdomen: soft; obese; nontender; bowel sounds present GU: No CVA tenderness; normal external genitalia; light vaginal bleeding; no vaginal discharge; no cervical motion tenderness; no adnexal tenderness Extremities: No deformity; full range of motion; pulses normal Neurologic: Awake, alert and oriented; motor function intact in all extremities and symmetric; no facial droop Skin: Warm and dry Psychiatric: Normal mood and affect   RESULTS  Summary of this visit's results, reviewed by myself:   EKG Interpretation  Date/Time:    Ventricular Rate:    PR Interval:    QRS Duration:   QT Interval:    QTC Calculation:   R Axis:     Text Interpretation:        Laboratory Studies: Results for orders placed or performed during the hospital encounter of 01/15/16 (from the past 24 hour(s))  Urinalysis, Routine w reflex microscopic- may I&O cath if menses     Status: Abnormal   Collection Time: 01/15/16  8:10 PM  Result Value Ref Range   Color, Urine YELLOW YELLOW   APPearance CLOUDY (A) CLEAR   Specific Gravity, Urine 1.008 1.005 - 1.030   pH 7.0 5.0 - 8.0   Glucose, UA NEGATIVE NEGATIVE mg/dL   Hgb urine dipstick LARGE (A) NEGATIVE   Bilirubin Urine NEGATIVE NEGATIVE   Ketones, ur NEGATIVE NEGATIVE mg/dL   Protein, ur NEGATIVE NEGATIVE mg/dL   Nitrite  NEGATIVE NEGATIVE   Leukocytes, UA SMALL (A) NEGATIVE  Urine microscopic-add on     Status: Abnormal   Collection Time: 01/15/16  8:10 PM  Result Value Ref Range   Squamous Epithelial / LPF 0-5 (A) NONE SEEN   WBC, UA 0-5 0 - 5 WBC/hpf   RBC / HPF 6-30 0 - 5 RBC/hpf   Bacteria, UA FEW (A) NONE SEEN   Trichomonas, UA PRESENT   CBC with Differential/Platelet     Status: Abnormal   Collection Time: 01/16/16 12:03 AM  Result Value Ref Range   WBC 13.4 (H) 4.0 - 10.5 K/uL   RBC 4.67 3.87 - 5.11 MIL/uL   Hemoglobin 12.7 12.0 - 15.0 g/dL   HCT 39.7 36.0 - 46.0 %   MCV 85.0 78.0 - 100.0 fL   MCH 27.2 26.0 - 34.0 pg   MCHC 32.0 30.0 - 36.0 g/dL   RDW 13.5 11.5 - 15.5 %   Platelets 404 (H) 150 - 400 K/uL   Neutrophils Relative % 63 %   Neutro Abs 8.5 (H) 1.7 - 7.7 K/uL   Lymphocytes Relative 29 %   Lymphs Abs 3.8 0.7 - 4.0 K/uL   Monocytes Relative 7 %   Monocytes Absolute 1.0 0.1 - 1.0 K/uL   Eosinophils Relative 1 %   Eosinophils Absolute 0.1 0.0 - 0.7 K/uL   Basophils Relative 0 %   Basophils Absolute 0.0 0.0 - 0.1 K/uL  Basic metabolic panel     Status: None   Collection Time: 01/16/16 12:03 AM  Result Value Ref Range   Sodium 138 135 - 145 mmol/L   Potassium 3.5 3.5 - 5.1 mmol/L   Chloride 107 101 - 111 mmol/L   CO2 26 22 - 32 mmol/L   Glucose, Bld 95 65 - 99 mg/dL   BUN 9 6 - 20 mg/dL   Creatinine, Ser 0.78 0.44 - 1.00 mg/dL   Calcium 9.1 8.9 - 10.3 mg/dL   GFR calc non Af Amer >60 >60 mL/min   GFR calc Af Amer >60 >60 mL/min   Anion gap 5 5 - 15  Wet prep, genital     Status: Abnormal   Collection Time: 01/16/16 12:31 AM  Result Value Ref Range   Yeast Wet Prep HPF POC NONE SEEN NONE SEEN   Trich, Wet Prep NONE SEEN NONE SEEN   Clue Cells Wet Prep HPF POC NONE SEEN NONE SEEN   WBC, Wet Prep HPF POC FEW (A) NONE SEEN   Sperm NONE SEEN    Imaging Studies: US Transvaginal Non-ob  Result Date: 01/16/2016 CLINICAL DATA:  RIGHT lower quadrant pain for 6 days, vaginal  bleeding. Follow-up abnormal CT scan. EXAM: TRANSABDOMINAL AND TRANSVAGINAL ULTRASOUND OF PELVIS DOPPLER ULTRASOUND OF OVARIES TECHNIQUE: Both transabdominal and transvaginal ultrasound examinations of the pelvis were performed. Transabdominal technique was performed for global imaging of the pelvis including uterus, ovaries, adnexal regions, and pelvic cul-de-sac. It was necessary to proceed with endovaginal exam following the transabdominal exam to visualize the adnexum. Color and duplex Doppler ultrasound was utilized  to evaluate blood flow to the ovaries. COMPARISON:  CT abdomen and pelvis January 16, 2016 at 0215 hours FINDINGS: Habitus limited examination. Uterus Measurements: 8.2 x 4.1 x 4.9 cm. No definite fibroids or other mass visualized. Endometrium Not well characterized due to body habitus. Right ovary Measurements: 11.4 x 8.1 x 8.1 cm. 8.8 x 6.6 x 6.5 cm avascular adnexal mass with fluid fluid level, lace-like component with increased through transmission. Left ovary Measurements: 7.4 x 3.8 x 3.3 cm. 2.7 cm echogenic cyst with increased through transmission most compatible with hemorrhagic cyst. Pulsed Doppler evaluation of both ovaries demonstrates normal low-resistance arterial and venous waveforms. Other findings No abnormal free fluid. IMPRESSION: Habitus limited examination. 8.8 cm hemorrhagic RIGHT adnexal cyst. 2.7 cm LEFT hemorrhagic cyst. Recommend follow-up sonogram in 6-12 weeks to ensure resolution. Electronically Signed   By: Elon Alas M.D.   On: 01/16/2016 05:39   US Pelvis Complete  Result Date: 01/16/2016 CLINICAL DATA:  RIGHT lower quadrant pain for 6 days, vaginal bleeding. Follow-up abnormal CT scan. EXAM: TRANSABDOMINAL AND TRANSVAGINAL ULTRASOUND OF PELVIS DOPPLER ULTRASOUND OF OVARIES TECHNIQUE: Both transabdominal and transvaginal ultrasound examinations of the pelvis were performed. Transabdominal technique was performed for global imaging of the pelvis including  uterus, ovaries, adnexal regions, and pelvic cul-de-sac. It was necessary to proceed with endovaginal exam following the transabdominal exam to visualize the adnexum. Color and duplex Doppler ultrasound was utilized to evaluate blood flow to the ovaries. COMPARISON:  CT abdomen and pelvis January 16, 2016 at 0215 hours FINDINGS: Habitus limited examination. Uterus Measurements: 8.2 x 4.1 x 4.9 cm. No definite fibroids or other mass visualized. Endometrium Not well characterized due to body habitus. Right ovary Measurements: 11.4 x 8.1 x 8.1 cm. 8.8 x 6.6 x 6.5 cm avascular adnexal mass with fluid fluid level, lace-like component with increased through transmission. Left ovary Measurements: 7.4 x 3.8 x 3.3 cm. 2.7 cm echogenic cyst with increased through transmission most compatible with hemorrhagic cyst. Pulsed Doppler evaluation of both ovaries demonstrates normal low-resistance arterial and venous waveforms. Other findings No abnormal free fluid. IMPRESSION: Habitus limited examination. 8.8 cm hemorrhagic RIGHT adnexal cyst. 2.7 cm LEFT hemorrhagic cyst. Recommend follow-up sonogram in 6-12 weeks to ensure resolution. Electronically Signed   By: Elon Alas M.D.   On: 01/16/2016 05:39   [place] Art/ven Flow Abd Pelv Doppler  Result Date: 01/16/2016 CLINICAL DATA:  RIGHT lower quadrant pain for 6 days, vaginal bleeding. Follow-up abnormal CT scan. EXAM: TRANSABDOMINAL AND TRANSVAGINAL ULTRASOUND OF PELVIS DOPPLER ULTRASOUND OF OVARIES TECHNIQUE: Both transabdominal and transvaginal ultrasound examinations of the pelvis were performed. Transabdominal technique was performed for global imaging of the pelvis including uterus, ovaries, adnexal regions, and pelvic cul-de-sac. It was necessary to proceed with endovaginal exam following the transabdominal exam to visualize the adnexum. Color and duplex Doppler ultrasound was utilized to evaluate blood flow to the ovaries. COMPARISON:  CT abdomen and pelvis  January 16, 2016 at 0215 hours FINDINGS: Habitus limited examination. Uterus Measurements: 8.2 x 4.1 x 4.9 cm. No definite fibroids or other mass visualized. Endometrium Not well characterized due to body habitus. Right ovary Measurements: 11.4 x 8.1 x 8.1 cm. 8.8 x 6.6 x 6.5 cm avascular adnexal mass with fluid fluid level, lace-like component with increased through transmission. Left ovary Measurements: 7.4 x 3.8 x 3.3 cm. 2.7 cm echogenic cyst with increased through transmission most compatible with hemorrhagic cyst. Pulsed Doppler evaluation of both ovaries demonstrates normal low-resistance arterial and venous waveforms. Other findings  No abnormal free fluid. IMPRESSION: Habitus limited examination. 8.8 cm hemorrhagic RIGHT adnexal cyst. 2.7 cm LEFT hemorrhagic cyst. Recommend follow-up sonogram in 6-12 weeks to ensure resolution. Electronically Signed   By: Elon Alas M.D.   On: 01/16/2016 05:39   Ct Renal Stone Study  Result Date: 01/16/2016 CLINICAL DATA:  Acute onset of right flank pain and right lower quadrant abdominal pain. Leukocytosis. Red blood cells and white blood cells in the urine. Initial encounter. EXAM: CT ABDOMEN AND PELVIS WITHOUT CONTRAST TECHNIQUE: Multidetector CT imaging of the abdomen and pelvis was performed following the standard protocol without IV contrast. COMPARISON:  None. FINDINGS: Lower chest: The visualized lung bases are grossly clear. The visualized portions of the mediastinum are unremarkable. Hepatobiliary: The liver is unremarkable in appearance. The gallbladder is unremarkable in appearance. The common bile duct remains normal in caliber. Pancreas: The pancreas is within normal limits. Spleen: The spleen is unremarkable in appearance. Adrenals/Urinary Tract: Adrenal glands are unremarkable in appearance. There is minimal right-sided hydronephrosis, with diffuse prominence of the right ureter. Soft tissue inflammation is seen tracking about the right ureter,  raising concern for right-sided ureteritis. There appears to be a large obstructing 1.1 x 0.8 cm stone at the distal right ureter, 4 cm above the right vesicoureteral junction. No nonobstructing renal stones are identified. The left kidney is unremarkable in appearance. Stomach/Bowel: The stomach is unremarkable in appearance. The small bowel is within normal limits. The appendix is not visualized; there is no evidence for appendicitis. Scattered diverticulosis is noted along the transverse colon, without evidence of diverticulitis. Vascular/Lymphatic: The abdominal aorta is unremarkable in appearance. The inferior vena cava is grossly unremarkable. No retroperitoneal lymphadenopathy is seen. No pelvic sidewall lymphadenopathy is identified. Reproductive: There is a large 9.2 x 6.6 x 7.6 cm mass at the right adnexa. An ovarian mass is a concern. The uterus is somewhat displaced but otherwise unremarkable. The left ovary is unremarkable appearance. The bladder is mildly distended and grossly unremarkable in appearance. Other: No additional soft tissue abnormalities are seen. Musculoskeletal: No significant soft tissue abnormalities are seen. IMPRESSION: 1. Minimal right-sided hydronephrosis, with apparent large obstructing 1.1 x 0.8 cm stone at the distal right ureter, 4 cm above the right vesicoureteral junction. 2. Soft tissue inflammation tracking about the right ureter, raising concern for right-sided ureteritis. 3. Large 9.2 x 6.6 x 7.6 cm mass noted at the right adnexa. This raises concern for an ovarian mass. Pelvic ultrasound is recommended for further evaluation. Electronically Signed   By: Garald Balding M.D.   On: 01/16/2016 02:51    ED COURSE  Nursing notes and initial vitals signs, including pulse oximetry, reviewed.  Vitals:   01/15/16 2251 01/16/16 0051 01/16/16 0308 01/16/16 0543  BP: (!) 210/135 (!) 224/138 170/100 (!) 196/128  Pulse: 86 67 74 70  Resp: 20 19 18 16   Temp:      TempSrc:       SpO2: 100% 95% 98% 96%  Weight:  (!) 419 lb (190.1 kg)    Height:  5\' 3"  (1.6 m)     3:57 AM The patient has been sleeping comfortably. She was advised of CT findings. We will obtain a pelvic ultrasound to evaluate for an ovarian mass.  6:12 AM Pain well controlled this time. We will refer to urology and [name]. She was advised that her stone is such a size that it may not pass on its own. She was advised to return immediately should she develop fever or  chills.  Height 5\' 3" , BMI 74.2.  PROCEDURES    ED DIAGNOSES     ICD-9-CM ICD-10-CM   1. Ureterolithiasis 592.1 N20.1   2. Abnormal CT scan, pelvis 793.6 R93.5 US Transvaginal Non-OB     US Pelvis Complete     US Pelvis Complete     [place] Art/Ven Flow Abd Pelv Doppler     [place] Art/Ven Flow Abd Pelv Doppler  3. Trichomoniasis of bladder 131.09 A59.09   4. Hemorrhagic ovarian cyst 620.2 N83.209   5. Morbid obesity with BMI of 70 and over, adult ([place]) 278.01 E66.01    V85.45 Z68.45   6. Hypertension not at goal 401.9 I10         [name], MD 01/16/16 0630

## 2016-01-16 ENCOUNTER — Emergency Department (HOSPITAL_COMMUNITY): Payer: BLUE CROSS/BLUE SHIELD

## 2016-01-16 LAB — HIV ANTIBODY (ROUTINE TESTING W REFLEX): HIV Screen 4th Generation wRfx: NONREACTIVE

## 2016-01-16 LAB — CBC WITH DIFFERENTIAL/PLATELET
BASOS ABS: 0 10*3/uL (ref 0.0–0.1)
BASOS PCT: 0 %
EOS ABS: 0.1 10*3/uL (ref 0.0–0.7)
EOS PCT: 1 %
HCT: 39.7 % (ref 36.0–46.0)
Hemoglobin: 12.7 g/dL (ref 12.0–15.0)
Lymphocytes Relative: 29 %
Lymphs Abs: 3.8 10*3/uL (ref 0.7–4.0)
MCH: 27.2 pg (ref 26.0–34.0)
MCHC: 32 g/dL (ref 30.0–36.0)
MCV: 85 fL (ref 78.0–100.0)
MONO ABS: 1 10*3/uL (ref 0.1–1.0)
Monocytes Relative: 7 %
Neutro Abs: 8.5 10*3/uL — ABNORMAL HIGH (ref 1.7–7.7)
Neutrophils Relative %: 63 %
PLATELETS: 404 10*3/uL — AB (ref 150–400)
RBC: 4.67 MIL/uL (ref 3.87–5.11)
RDW: 13.5 % (ref 11.5–15.5)
WBC: 13.4 10*3/uL — AB (ref 4.0–10.5)

## 2016-01-16 LAB — URINALYSIS, ROUTINE W REFLEX MICROSCOPIC
Bilirubin Urine: NEGATIVE
Glucose, UA: NEGATIVE mg/dL
KETONES UR: NEGATIVE mg/dL
NITRITE: NEGATIVE
PH: 7 (ref 5.0–8.0)
PROTEIN: NEGATIVE mg/dL
Specific Gravity, Urine: 1.01 (ref 1.005–1.030)

## 2016-01-16 LAB — PREGNANCY, URINE: PREG TEST UR: NEGATIVE

## 2016-01-16 LAB — BASIC METABOLIC PANEL
ANION GAP: 5 (ref 5–15)
BUN: 9 mg/dL (ref 6–20)
CALCIUM: 9.1 mg/dL (ref 8.9–10.3)
CO2: 26 mmol/L (ref 22–32)
Chloride: 107 mmol/L (ref 101–111)
Creatinine, Ser: 0.78 mg/dL (ref 0.44–1.00)
GLUCOSE: 95 mg/dL (ref 65–99)
Potassium: 3.5 mmol/L (ref 3.5–5.1)
SODIUM: 138 mmol/L (ref 135–145)

## 2016-01-16 LAB — URINE MICROSCOPIC-ADD ON

## 2016-01-16 LAB — GC/CHLAMYDIA PROBE AMP (~~LOC~~) NOT AT ARMC
Chlamydia: NEGATIVE
NEISSERIA GONORRHEA: NEGATIVE

## 2016-01-16 LAB — WET PREP, GENITAL
CLUE CELLS WET PREP: NONE SEEN
Sperm: NONE SEEN
Trich, Wet Prep: NONE SEEN
YEAST WET PREP: NONE SEEN

## 2016-01-16 MED ORDER — ONDANSETRON HCL 4 MG/2ML IJ SOLN
4.0000 mg | Freq: Once | INTRAMUSCULAR | Status: AC
  Administered 2016-01-16: 4 mg via INTRAVENOUS
  Filled 2016-01-16 (×2): qty 2

## 2016-01-16 MED ORDER — TAMSULOSIN HCL 0.4 MG PO CAPS
0.4000 mg | ORAL_CAPSULE | Freq: Once | ORAL | Status: AC
  Administered 2016-01-16: 0.4 mg via ORAL
  Filled 2016-01-16: qty 1

## 2016-01-16 MED ORDER — TAMSULOSIN HCL 0.4 MG PO CAPS: ORAL_CAPSULE | ORAL | 1 refills | Status: DC

## 2016-01-16 MED ORDER — SODIUM CHLORIDE 0.9 % IV SOLN
Freq: Once | INTRAVENOUS | Status: AC
  Administered 2016-01-16: 06:00:00 via INTRAVENOUS

## 2016-01-16 MED ORDER — ONDANSETRON 8 MG PO TBDP: 8.0000 mg | ORAL_TABLET | Freq: Three times a day (TID) | ORAL | 0 refills | Status: DC | PRN

## 2016-01-16 MED ORDER — OXYCODONE-ACETAMINOPHEN 5-325 MG PO TABS: 1.0000 | ORAL_TABLET | Freq: Four times a day (QID) | ORAL | 0 refills | Status: DC | PRN

## 2016-01-16 MED ORDER — METRONIDAZOLE 500 MG PO TABS
2000.0000 mg | ORAL_TABLET | Freq: Once | ORAL | Status: AC
  Administered 2016-01-16: 2000 mg via ORAL
  Filled 2016-01-16: qty 4

## 2016-01-16 MED ORDER — FENTANYL CITRATE (PF) 100 MCG/2ML IJ SOLN
100.0000 ug | Freq: Once | INTRAMUSCULAR | Status: AC
  Administered 2016-01-16: 100 ug via INTRAVENOUS
  Filled 2016-01-16 (×2): qty 2

## 2016-01-16 NOTE — ED Notes (Addendum)
IV access attempted x 2 in bilateral AC both unsuccessful. Charge nurse notified. Ultrasound tech currently at bedside.

## 2016-05-13 ENCOUNTER — Encounter (HOSPITAL_COMMUNITY): Payer: Self-pay | Admitting: Emergency Medicine

## 2016-05-13 DIAGNOSIS — M791 Myalgia: Secondary | ICD-10-CM | POA: Diagnosis not present

## 2016-05-13 DIAGNOSIS — I10 Essential (primary) hypertension: Secondary | ICD-10-CM | POA: Insufficient documentation

## 2016-05-13 DIAGNOSIS — R509 Fever, unspecified: Secondary | ICD-10-CM | POA: Diagnosis not present

## 2016-05-13 DIAGNOSIS — R067 Sneezing: Secondary | ICD-10-CM | POA: Diagnosis not present

## 2016-05-13 DIAGNOSIS — F17200 Nicotine dependence, unspecified, uncomplicated: Secondary | ICD-10-CM | POA: Diagnosis not present

## 2016-05-13 DIAGNOSIS — R112 Nausea with vomiting, unspecified: Secondary | ICD-10-CM | POA: Insufficient documentation

## 2016-05-13 DIAGNOSIS — R05 Cough: Secondary | ICD-10-CM | POA: Insufficient documentation

## 2016-05-13 DIAGNOSIS — Z79899 Other long term (current) drug therapy: Secondary | ICD-10-CM | POA: Diagnosis not present

## 2016-05-13 NOTE — ED Triage Notes (Signed)
Pt presents with complaints of flu like symptoms that began 2 days ago.  Hoarse voice upon assessment. Generalized body aches and emesis x3 in the past 24 hours. Pt reports talking alka seltzer without relief.

## 2016-05-14 ENCOUNTER — Emergency Department (HOSPITAL_COMMUNITY)
Admission: EM | Admit: 2016-05-14 | Discharge: 2016-05-14 | Disposition: A | Discharge status: Home / Self Care | Payer: BLUE CROSS/BLUE SHIELD | Attending: Emergency Medicine | Admitting: Emergency Medicine

## 2016-05-14 ENCOUNTER — Emergency Department (HOSPITAL_COMMUNITY): Payer: BLUE CROSS/BLUE SHIELD

## 2016-05-14 DIAGNOSIS — R6889 Other general symptoms and signs: Secondary | ICD-10-CM

## 2016-05-14 DIAGNOSIS — I10 Essential (primary) hypertension: Secondary | ICD-10-CM

## 2016-05-14 LAB — POC URINE PREG, ED: Preg Test, Ur: NEGATIVE

## 2016-05-14 MED ORDER — IRBESARTAN 150 MG PO TABS
150.0000 mg | ORAL_TABLET | Freq: Every day | ORAL | Status: DC
  Administered 2016-05-14: 150 mg via ORAL
  Filled 2016-05-14: qty 1

## 2016-05-14 MED ORDER — OSELTAMIVIR PHOSPHATE 75 MG PO CAPS: 75.0000 mg | ORAL_CAPSULE | Freq: Two times a day (BID) | ORAL | 0 refills | Status: DC

## 2016-05-14 MED ORDER — ONDANSETRON HCL 4 MG PO TABS: 4.0000 mg | ORAL_TABLET | Freq: Four times a day (QID) | ORAL | 0 refills | Status: DC

## 2016-05-14 MED ORDER — HYDROCHLOROTHIAZIDE 12.5 MG PO CAPS
12.5000 mg | ORAL_CAPSULE | Freq: Once | ORAL | Status: AC
  Administered 2016-05-14: 12.5 mg via ORAL
  Filled 2016-05-14: qty 1

## 2016-05-14 MED ORDER — BENZONATATE 100 MG PO CAPS: 100.0000 mg | ORAL_CAPSULE | Freq: Three times a day (TID) | ORAL | 0 refills | Status: DC

## 2016-05-14 NOTE — ED Provider Notes (Signed)
[facility] DEPT Provider Note   CSN: [id number] Arrival date & time: 05/13/16  2108  By signing my name below, I, Jeanell Sparrow, attest that this documentation has been prepared under the direction and in the presence of non-physician practitioner, Armstead Peaks, PA-C. Electronically Signed: Jeanell Sparrow, Scribe. 05/14/2016. 1:50 AM.  History   Chief Complaint Chief Complaint  Patient presents with  . Flu Like Symptoms   The history is provided by the patient. No language interpreter was used.   HPI Comments: Kristi Bowen is a 31 y.o. female who presents to the Emergency Department complaining of vomiting that started yesterday ago. She states she has been having gradually worsening URI-like symptoms. She had 3 episodes of vomiting. She took Administrator and OTC medication without relief. She reports associated symptoms of sneezing, coughing, congestion, subjective fever, and generalized myalgias. Pt's temperature in the ED today was 98.5. She denies any influenza vaccine this year, sore throat, ear pain, diarrhea, SOB, abdominal pain, or urinary symptoms.   Past Medical History:  Diagnosis Date  . Hypertension     There are no active problems to display for this patient.   History reviewed. No pertinent surgical history.  OB History    No data available       Home Medications    Prior to Admission medications   Medication Sig Start Date End Date Taking? Authorizing Provider  albuterol (PROVENTIL HFA;VENTOLIN HFA) 108 (90 Base) MCG/ACT inhaler Inhale 2 puffs into the lungs every 4 (four) hours as needed for wheezing or shortness of breath. 08/03/15   [name], MD  benzonatate (TESSALON) 100 MG capsule Take 1 capsule (100 mg total) by mouth every 8 (eight) hours. 05/14/16   Frederica Kuster, PA-C  ondansetron (ZOFRAN ODT) 8 MG disintegrating tablet Take 1 tablet (8 mg total) by mouth every 8 (eight) hours as needed for nausea or vomiting. 01/16/16   [name], MD    ondansetron (ZOFRAN) 4 MG tablet Take 1 tablet (4 mg total) by mouth every 6 (six) hours. 05/14/16   Frederica Kuster, PA-C  oseltamivir (TAMIFLU) 75 MG capsule Take 1 capsule (75 mg total) by mouth every 12 (twelve) hours. 05/14/16   Frederica Kuster, PA-C  oxyCODONE-acetaminophen (PERCOCET) 5-325 MG tablet Take 1-2 tablets by mouth every 6 (six) hours as needed (for pain). 01/16/16   John Molpus, MD  tamsulosin (FLOMAX) 0.4 MG CAPS capsule Take one capsule daily until stone passes. 01/16/16   John Molpus, MD  valsartan-hydrochlorothiazide (DIOVAN-HCT) 160-12.5 MG tablet Take 1 tablet by mouth daily. 01/09/16   Historical Provider, MD    Family History No family history on file.  Social History Social History  Substance Use Topics  . Smoking status: Current Every Day Smoker  . Smokeless tobacco: Never Used  . Alcohol use No     Allergies   Patient has no known allergies.   Review of Systems Review of Systems  Constitutional: Positive for fever (Subjective). Negative for chills.  HENT: Positive for congestion and sneezing. Negative for ear pain, facial swelling and sore throat.   Respiratory: Positive for cough. Negative for shortness of breath.   Cardiovascular: Negative for chest pain.  Gastrointestinal: Positive for nausea and vomiting. Negative for abdominal pain and diarrhea.  Genitourinary: Negative for dysuria.  Musculoskeletal: Negative for back pain.  Skin: Negative for rash and wound.  Neurological: Negative for headaches.  Psychiatric/Behavioral: The patient is not nervous/anxious.      Physical Exam Updated Vital Signs  BP (!) 222/136 (BP Location: Left Arm)   Pulse 98   Temp 98.5 F (36.9 C) (Oral)   Resp 20   Ht 5\' 4"  (1.626 m)   LMP 05/06/2016   SpO2 96%   Physical Exam  Constitutional: She appears well-developed and well-nourished. No distress.  HENT:  Head: Normocephalic and atraumatic.  Mouth/Throat: Oropharynx is clear and moist. No oropharyngeal  exudate.  Eyes: Conjunctivae are normal. Pupils are equal, round, and reactive to light. Right eye exhibits no discharge. Left eye exhibits no discharge. No scleral icterus.  Neck: Normal range of motion. Neck supple. No thyromegaly present.  Cardiovascular: Normal rate, regular rhythm, normal heart sounds and intact distal pulses.  Exam reveals no gallop and no friction rub.   No murmur heard. Pulmonary/Chest: Effort normal. No stridor. No respiratory distress. She has decreased breath sounds (could be due to body habitus). She has no wheezes. She has no rales.  Abdominal: Soft. Bowel sounds are normal. She exhibits no distension. There is no tenderness. There is no rebound and no guarding.  Musculoskeletal: She exhibits no edema.  Lymphadenopathy:    She has no cervical adenopathy.  Neurological: She is alert. Coordination normal.  Skin: Skin is warm and dry. No rash noted. She is not diaphoretic. No pallor.  Psychiatric: She has a normal mood and affect.  Nursing note and vitals reviewed.    ED Treatments / Results  DIAGNOSTIC STUDIES: Oxygen Saturation is 96% on RA, normal by my interpretation.    COORDINATION OF CARE: 1:54 AM- Pt advised of plan for treatment and pt agrees.  Labs (all labs ordered are listed, but only abnormal results are displayed) Labs Reviewed  POC URINE PREG, ED    EKG  EKG Interpretation None       Radiology No results found.  Procedures Procedures (including critical care time)  Medications Ordered in ED Medications  hydrochlorothiazide (MICROZIDE) capsule 12.5 mg (12.5 mg Oral Given 05/14/16 0228)     Initial Impression / Assessment and Plan / ED Course  I have reviewed the triage vital signs and the nursing notes.  Pertinent labs & imaging results that were available during my care of the patient were reviewed by me and considered in my medical decision making (see chart for details).     Patient with symptoms consistent with  influenza.  Vitals are stable, low-grade fever.  No signs of dehydration, tolerating PO's. CXR shows moderate cardiomegaly, stable from previous, no pulmonary edema, and no other active cardiopulmonary disease.  Discussed the cost versus benefit of Tamiflu treatment with the patient and patient has opted for Tamiflu treatment at this time. Patient will be discharged with instructions to orally hydrate, rest, and use over-the-counter medications such as anti-inflammatories ibuprofen and Aleve for muscle aches and Tylenol for fever.  Patient will also be given a cough suppressant and Zofran. Return precautions discussed. Patient understands and agrees with plan. Patient given blood pressure medication in the ED with reduction of blood pressure. Patient had not taken her medications today. Patient with otherwise stable vitals discharged in satisfactory condition.   Final Clinical Impressions(s) / ED Diagnoses   Final diagnoses:  Influenza-like symptoms  Hypertension, unspecified type    New Prescriptions Discharge Medication List as of 05/14/2016  3:29 AM    START taking these medications   Details  benzonatate (TESSALON) 100 MG capsule Take 1 capsule (100 mg total) by mouth every 8 (eight) hours., Starting Fri 05/14/2016, Print    ondansetron (ZOFRAN) 4  MG tablet Take 1 tablet (4 mg total) by mouth every 6 (six) hours., Starting Fri 05/14/2016, Print    oseltamivir (TAMIFLU) 75 MG capsule Take 1 capsule (75 mg total) by mouth every 12 (twelve) hours., Starting Fri 05/14/2016, Print       I personally performed the services described in this documentation, which was scribed in my presence. The recorded information has been reviewed and is accurate.     Frederica Kuster, PA-C 05/21/16 1548    April Palumbo, MD 05/25/16 1949

## 2016-05-14 NOTE — ED Notes (Signed)
ED Provider at bedside. 

## 2016-05-14 NOTE — Discharge Instructions (Signed)
Medications: Tessalon, Zofran, Tamiflu  Treatment: Take Tessalon every 8 hours as needed for cough. Take Zofran every 6 hours as needed for nausea and vomiting. Take Tamiflu twice daily for 5 days. You can alternate Tylenol and ibuprofen as prescribed over-the-counter as needed for fevers and body aches. Make sure to drink plenty of water and get plenty of rest. Resume taking your blood pressure medication as prescribed at home.  Follow-up: Please follow-up with your primary care provider for recheck of blood pressure and follow-up of today's visit. Please return to emergency department if you develop any new or worsening symptoms.

## 2016-05-14 NOTE — ED Provider Notes (Signed)
Medical screening examination/treatment/procedure(s) were performed by non-physician practitioner and as supervising physician I was immediately available for consultation/collaboration.   EKG Interpretation None        Arek Spadafore, MD 05/14/16 [phone]

## 2016-05-14 NOTE — ED Notes (Signed)
Pt has hx of hypertension and states she has not taken her blood pressure medication since she has been sick  Pt states she has been sick for the past 3 days

## 2017-12-22 ENCOUNTER — Emergency Department (HOSPITAL_COMMUNITY)
Admission: EM | Admit: 2017-12-22 | Discharge: 2017-12-22 | Disposition: A | Discharge status: Home / Self Care | Payer: BLUE CROSS/BLUE SHIELD | Attending: Emergency Medicine | Admitting: Emergency Medicine

## 2017-12-22 ENCOUNTER — Other Ambulatory Visit: Payer: Self-pay

## 2017-12-22 DIAGNOSIS — N939 Abnormal uterine and vaginal bleeding, unspecified: Secondary | ICD-10-CM | POA: Diagnosis not present

## 2017-12-22 DIAGNOSIS — I10 Essential (primary) hypertension: Secondary | ICD-10-CM | POA: Insufficient documentation

## 2017-12-22 DIAGNOSIS — F17210 Nicotine dependence, cigarettes, uncomplicated: Secondary | ICD-10-CM | POA: Diagnosis not present

## 2017-12-22 DIAGNOSIS — N938 Other specified abnormal uterine and vaginal bleeding: Secondary | ICD-10-CM

## 2017-12-22 LAB — URINALYSIS, ROUTINE W REFLEX MICROSCOPIC
Bilirubin Urine: NEGATIVE
Glucose, UA: NEGATIVE mg/dL
Ketones, ur: NEGATIVE mg/dL
NITRITE: NEGATIVE
PH: 7 (ref 5.0–8.0)
Protein, ur: 30 mg/dL — AB
RBC / HPF: >50 RBC/hpf — ABNORMAL HIGH (ref 0–5)
SPECIFIC GRAVITY, URINE: 1.017 (ref 1.005–1.030)

## 2017-12-22 LAB — COMPREHENSIVE METABOLIC PANEL
ALK PHOS: 52 U/L (ref 38–126)
ALT: 12 U/L (ref 0–44)
ANION GAP: 11 (ref 5–15)
AST: 16 U/L (ref 15–41)
Albumin: 4.2 g/dL (ref 3.5–5.0)
BUN: 12 mg/dL (ref 6–20)
CALCIUM: 9 mg/dL (ref 8.9–10.3)
CO2: 24 mmol/L (ref 22–32)
Chloride: 104 mmol/L (ref 98–111)
Creatinine, Ser: 0.86 mg/dL (ref 0.44–1.00)
GFR calc non Af Amer: >60 mL/min (ref >60)
Glucose, Bld: 130 mg/dL — ABNORMAL HIGH (ref 70–99)
Potassium: 3.8 mmol/L (ref 3.5–5.1)
SODIUM: 139 mmol/L (ref 135–145)
TOTAL PROTEIN: 8.1 g/dL (ref 6.5–8.1)
Total Bilirubin: 0.5 mg/dL (ref 0.3–1.2)

## 2017-12-22 LAB — WET PREP, GENITAL
CLUE CELLS WET PREP: NONE SEEN
Sperm: NONE SEEN
TRICH WET PREP: NONE SEEN
YEAST WET PREP: NONE SEEN

## 2017-12-22 LAB — CBC
HCT: 35.5 % — ABNORMAL LOW (ref 36.0–46.0)
HEMOGLOBIN: 11.2 g/dL — AB (ref 12.0–15.0)
MCH: 24.2 pg — ABNORMAL LOW (ref 26.0–34.0)
MCHC: 31.5 g/dL (ref 30.0–36.0)
MCV: 76.8 fL — ABNORMAL LOW (ref 78.0–100.0)
Platelets: 525 10*3/uL — ABNORMAL HIGH (ref 150–400)
RBC: 4.62 MIL/uL (ref 3.87–5.11)
RDW: 15.7 % — ABNORMAL HIGH (ref 11.5–15.5)
WBC: 14.6 10*3/uL — ABNORMAL HIGH (ref 4.0–10.5)

## 2017-12-22 LAB — LIPASE, BLOOD: Lipase: 29 U/L (ref 11–51)

## 2017-12-22 LAB — PREGNANCY, URINE: PREG TEST UR: NEGATIVE

## 2017-12-22 MED ORDER — ACETAMINOPHEN 500 MG PO TABS
1000.0000 mg | ORAL_TABLET | Freq: Once | ORAL | Status: AC
  Administered 2017-12-22: 1000 mg via ORAL
  Filled 2017-12-22: qty 2

## 2017-12-22 MED ORDER — KETOROLAC TROMETHAMINE 15 MG/ML IJ SOLN
15.0000 mg | Freq: Once | INTRAMUSCULAR | Status: AC
  Administered 2017-12-22: 15 mg via INTRAVENOUS

## 2017-12-22 MED ORDER — MEGESTROL ACETATE 40 MG PO TABS
80.0000 mg | ORAL_TABLET | Freq: Every day | ORAL | Status: DC
  Administered 2017-12-22: 80 mg via ORAL
  Filled 2017-12-22: qty 2

## 2017-12-22 MED ORDER — MEGESTROL ACETATE 40 MG PO TABS: ORAL_TABLET | ORAL | 0 refills | Status: DC

## 2017-12-22 MED ORDER — KETOROLAC TROMETHAMINE 60 MG/2ML IM SOLN
15.0000 mg | Freq: Once | INTRAMUSCULAR | Status: DC
  Filled 2017-12-22: qty 2

## 2017-12-22 MED ORDER — OXYCODONE HCL 5 MG PO TABS
5.0000 mg | ORAL_TABLET | Freq: Once | ORAL | Status: AC
  Administered 2017-12-22: 5 mg via ORAL
  Filled 2017-12-22: qty 1

## 2017-12-22 MED ORDER — MORPHINE SULFATE 15 MG PO TABS: 15.0000 mg | ORAL_TABLET | ORAL | 0 refills | Status: DC | PRN

## 2017-12-22 MED ORDER — ONDANSETRON 4 MG PO TBDP: ORAL_TABLET | ORAL | 0 refills | Status: DC

## 2017-12-22 MED ORDER — DIPHENHYDRAMINE HCL 50 MG/ML IJ SOLN
25.0000 mg | Freq: Once | INTRAMUSCULAR | Status: AC
  Administered 2017-12-22: 25 mg via INTRAVENOUS

## 2017-12-22 MED ORDER — DIPHENHYDRAMINE HCL 50 MG/ML IJ SOLN
25.0000 mg | Freq: Once | INTRAMUSCULAR | Status: DC
  Filled 2017-12-22: qty 1

## 2017-12-22 MED ORDER — PROCHLORPERAZINE EDISYLATE 10 MG/2ML IJ SOLN
10.0000 mg | Freq: Once | INTRAMUSCULAR | Status: AC
  Administered 2017-12-22: 10 mg via INTRAMUSCULAR
  Filled 2017-12-22: qty 2

## 2017-12-22 NOTE — ED Notes (Signed)
Patient reports hx HTN, not currently taking medications.

## 2017-12-22 NOTE — ED Triage Notes (Signed)
Patient reports vaginal bleeding that started. Patient states she has bleed through a period and tampon in 2 hours. Patient had period this week it started again today. Patient reports midsternal chest pain, +SOB, lower back pain. Unknown pregnancy status. +Nausea. -diarrhea Denies urinary complaints. Denies abdominal surgeries. Appear uncomfortable.

## 2017-12-22 NOTE — ED Provider Notes (Signed)
[facility] DEPT Provider Note   CSN: [id number] Arrival date & time: 12/22/17  1813     History   Chief Complaint Chief Complaint  Patient presents with  . Vaginal Bleeding  . Abdominal Pain  . Nausea    HPI Kristi Bowen is a 32 y.o. female.  32 yo F with a cc of vaginal bleeding.  Going on for the past day.  The patient recently stopped her menstrual cycle and then started back.  She feels that this is much heavier than typical and is also more painful.  She has been pregnant once before and has a 32 year old.  Denies any other pregnancies.  Denies any other GYN issues.  Denies birth control use.  The history is provided by the patient.  Vaginal Bleeding  Primary symptoms include vaginal bleeding.  Primary symptoms include no dysuria. There has been no fever. The fever has been present for 5 days or more. This is a new problem. The current episode started less than 1 hour ago. The problem occurs constantly. The problem has not changed since onset.Associated symptoms include abdominal pain. Pertinent negatives include no nausea, no vomiting and no dizziness. She has tried nothing for the symptoms. The treatment provided no relief.  Abdominal Pain   Pertinent negatives include fever, nausea, vomiting, dysuria, headaches, arthralgias and myalgias.    Past Medical History:  Diagnosis Date  . Hypertension     There are no active problems to display for this patient.   No past surgical history on file.   OB History   None      Home Medications    Prior to Admission medications   Medication Sig Start Date End Date Taking? Authorizing Provider  acetaminophen (TYLENOL) 500 MG tablet Take 1,000 mg by mouth every 6 (six) hours as needed for moderate pain.   Yes [provider]  Aspirin-Salicylamide-Caffeine (BC HEADACHE PO) Take 1 packet by mouth daily as needed (pain).   Yes [provider]  albuterol (PROVENTIL  HFA;VENTOLIN HFA) 108 (90 Base) MCG/ACT inhaler Inhale 2 puffs into the lungs every 4 (four) hours as needed for wheezing or shortness of breath. Patient not taking: Reported on 12/22/2017 08/03/15   Horton, Barbette Hair, MD  benzonatate (TESSALON) 100 MG capsule Take 1 capsule (100 mg total) by mouth every 8 (eight) hours. Patient not taking: Reported on 12/22/2017 05/14/16   Frederica Kuster, PA-C  megestrol (MEGACE) 40 MG tablet 2 tabs three times a day for the next three days, then 2 tabs twice a day until seen by an obgyn 12/22/17   Deno Etienne, DO  morphine (MSIR) 15 MG tablet Take 1 tablet (15 mg total) by mouth every 4 (four) hours as needed for severe pain. 12/22/17   Deno Etienne, DO  ondansetron (ZOFRAN ODT) 4 MG disintegrating tablet 4mg  ODT q4 hours prn nausea/vomit 12/22/17   Deno Etienne, DO  ondansetron (ZOFRAN) 4 MG tablet Take 1 tablet (4 mg total) by mouth every 6 (six) hours. Patient not taking: Reported on 12/22/2017 05/14/16   Frederica Kuster, PA-C  oseltamivir (TAMIFLU) 75 MG capsule Take 1 capsule (75 mg total) by mouth every 12 (twelve) hours. Patient not taking: Reported on 12/22/2017 05/14/16   Frederica Kuster, PA-C  oxyCODONE-acetaminophen (PERCOCET) 5-325 MG tablet Take 1-2 tablets by mouth every 6 (six) hours as needed (for pain). Patient not taking: Reported on 12/22/2017 01/16/16   Molpus, John, MD  tamsulosin (FLOMAX) 0.4 MG CAPS capsule  Take one capsule daily until stone passes. Patient not taking: Reported on 12/22/2017 01/16/16   Molpus, Jenny Reichmann, MD    Family History No family history on file.  Social History Social History   Tobacco Use  . Smoking status: Current Every Day Smoker  . Smokeless tobacco: Never Used  Substance Use Topics  . Alcohol use: No  . Drug use: No     Allergies   Patient has no known allergies.   Review of Systems Review of Systems  Constitutional: Negative for chills and fever.  HENT: Negative for congestion and rhinorrhea.   Eyes: Negative for  redness and visual disturbance.  Respiratory: Negative for shortness of breath and wheezing.   Cardiovascular: Negative for chest pain and palpitations.  Gastrointestinal: Positive for abdominal pain. Negative for nausea and vomiting.  Genitourinary: Positive for vaginal bleeding. Negative for dysuria and urgency.  Musculoskeletal: Negative for arthralgias and myalgias.  Skin: Negative for pallor and wound.  Neurological: Negative for dizziness and headaches.     Physical Exam Updated Vital Signs BP (!) 217/138 (BP Location: Left Arm)   Pulse 78   Temp 98.3 F (36.8 C) (Oral)   Resp 18   Ht 5\' 3"  (1.6 m)   Wt (!) 186 kg   LMP 12/15/2017   SpO2 99%   BMI 72.63 kg/m   Physical Exam  Constitutional: She is oriented to person, place, and time. She appears well-developed and well-nourished. No distress.  Morbidly obese  HENT:  Head: Normocephalic and atraumatic.  Eyes: Pupils are equal, round, and reactive to light. EOM are normal.  Neck: Normal range of motion. Neck supple.  Cardiovascular: Normal rate and regular rhythm. Exam reveals no gallop and no friction rub.  No murmur heard. Pulmonary/Chest: Effort normal. She has no wheezes. She has no rales.  Abdominal: Soft. She exhibits no distension and no mass. There is no tenderness. There is no guarding.  Genitourinary: Cervix exhibits no motion tenderness.  Genitourinary Comments: Pinkish watery discharge  Musculoskeletal: She exhibits no edema or tenderness.  Neurological: She is alert and oriented to person, place, and time.  Skin: Skin is warm and dry. She is not diaphoretic.  Psychiatric: She has a normal mood and affect. Her behavior is normal.  Nursing note and vitals reviewed.    ED Treatments / Results  Labs (all labs ordered are listed, but only abnormal results are displayed) Labs Reviewed  WET PREP, GENITAL - Abnormal; Notable for the following components:      Result Value   WBC, Wet Prep HPF POC FEW (*)      All other components within normal limits  URINALYSIS, ROUTINE W REFLEX MICROSCOPIC - Abnormal; Notable for the following components:   Hgb urine dipstick LARGE (*)    Protein, ur 30 (*)    Leukocytes, UA TRACE (*)    RBC / HPF >50 (*)    Bacteria, UA RARE (*)    All other components within normal limits  COMPREHENSIVE METABOLIC PANEL - Abnormal; Notable for the following components:   Glucose, Bld 130 (*)    All other components within normal limits  CBC - Abnormal; Notable for the following components:   WBC 14.6 (*)    Hemoglobin 11.2 (*)    HCT 35.5 (*)    MCV 76.8 (*)    MCH 24.2 (*)    RDW 15.7 (*)    Platelets 525 (*)    All other components within normal limits  LIPASE, BLOOD  PREGNANCY,  URINE  GC/CHLAMYDIA PROBE AMP ([place]) NOT AT [facility]    EKG None  Radiology No results found.  Procedures Procedures (including critical care time)  Medications Ordered in ED Medications  megestrol (MEGACE) tablet 80 mg (has no administration in time range)  oxyCODONE (Oxy IR/ROXICODONE) immediate release tablet 5 mg (has no administration in time range)  prochlorperazine (COMPAZINE) injection 10 mg (10 mg Intramuscular Given 12/22/17 2034)  acetaminophen (TYLENOL) tablet 1,000 mg (1,000 mg Oral Given 12/22/17 2043)  diphenhydrAMINE (BENADRYL) injection 25 mg (25 mg Intravenous Given 12/22/17 2036)  ketorolac (TORADOL) 15 MG/ML injection 15 mg (15 mg Intravenous Given 12/22/17 2038)     Initial Impression / Assessment and Plan / ED Course  I have reviewed the triage vital signs and the nursing notes.  Pertinent labs & imaging results that were available during my care of the patient were reviewed by me and considered in my medical decision making (see chart for details).     32 yo F with a chief complaint of vaginal bleeding.  This is been going on for the past for 5 hours.  She has bled through her normal pads quicker than she expected.  She is having worsening cramping than  her normal menstrual cycle.  Was just on her cycle and then started back today.  She feels it is unlikely that she is pregnant.  Her pregnancy test is negative.  Her hemoglobin is 11.2 which is slightly lower than last checked as this is over a year ago.  Patient's heart rate is improved with Toradol and fluids.  Pelvic exam with pinkish fluid noted in the vault.  Difficult to fully see the cervix based on body habitus.  I discussed the case with the GYN on-call, Dr. Harolyn Rutherford. Recommended starting on megace 80mg  tid for the next three days, then 80mg  bid until seen by her gyn.  They will call to set up an appointment.   9:53 PM:  I have discussed the diagnosis/risks/treatment options with the patient and family and believe the pt to be eligible for discharge home to follow-up with PCP. We also discussed returning to the ED immediately if new or worsening sx occur. We discussed the sx which are most concerning (e.g., sudden worsening pain, fever, inability to tolerate by mouth) that necessitate immediate return. Medications administered to the patient during their visit and any new prescriptions provided to the patient are listed below.  Medications given during this visit Medications  megestrol (MEGACE) tablet 80 mg (has no administration in time range)  oxyCODONE (Oxy IR/ROXICODONE) immediate release tablet 5 mg (has no administration in time range)  prochlorperazine (COMPAZINE) injection 10 mg (10 mg Intramuscular Given 12/22/17 2034)  acetaminophen (TYLENOL) tablet 1,000 mg (1,000 mg Oral Given 12/22/17 2043)  diphenhydrAMINE (BENADRYL) injection 25 mg (25 mg Intravenous Given 12/22/17 2036)  ketorolac (TORADOL) 15 MG/ML injection 15 mg (15 mg Intravenous Given 12/22/17 2038)      The patient appears reasonably screen and/or stabilized for discharge and I doubt any other medical condition or other [facility] requiring further screening, evaluation, or treatment in the ED at this time prior to discharge.     Final Clinical Impressions(s) / ED Diagnoses   Final diagnoses:  DUB (dysfunctional uterine bleeding)    ED Discharge Orders         Ordered    megestrol (MEGACE) 40 MG tablet     12/22/17 2146    morphine (MSIR) 15 MG tablet  Every 4 hours PRN  12/22/17 2151    ondansetron (ZOFRAN ODT) 4 MG disintegrating tablet     12/22/17 2151           Deno Etienne, DO 12/22/17 2153

## 2017-12-23 LAB — GC/CHLAMYDIA PROBE AMP (~~LOC~~) NOT AT ARMC
Chlamydia: NEGATIVE
NEISSERIA GONORRHEA: NEGATIVE

## 2018-01-27 ENCOUNTER — Ambulatory Visit: Payer: BLUE CROSS/BLUE SHIELD | Admitting: Obstetrics and Gynecology

## 2018-03-02 ENCOUNTER — Encounter: Payer: Self-pay | Admitting: *Deleted

## 2018-05-15 DIAGNOSIS — Z0000 Encounter for general adult medical examination without abnormal findings: Secondary | ICD-10-CM | POA: Diagnosis not present

## 2018-05-15 DIAGNOSIS — D649 Anemia, unspecified: Secondary | ICD-10-CM | POA: Diagnosis not present

## 2018-05-15 DIAGNOSIS — Z6841 Body Mass Index (BMI) 40.0 and over, adult: Secondary | ICD-10-CM | POA: Diagnosis not present

## 2018-06-20 DIAGNOSIS — Z6841 Body Mass Index (BMI) 40.0 and over, adult: Secondary | ICD-10-CM | POA: Diagnosis not present

## 2018-06-20 DIAGNOSIS — R635 Abnormal weight gain: Secondary | ICD-10-CM | POA: Diagnosis not present

## 2018-10-12 ENCOUNTER — Encounter (HOSPITAL_COMMUNITY): Payer: Self-pay

## 2018-10-12 ENCOUNTER — Emergency Department (HOSPITAL_COMMUNITY)
Admission: EM | Admit: 2018-10-12 | Discharge: 2018-10-13 | Disposition: A | Discharge status: Home / Self Care | Payer: BC Managed Care – PPO | Attending: Emergency Medicine | Admitting: Emergency Medicine

## 2018-10-12 ENCOUNTER — Emergency Department (HOSPITAL_COMMUNITY): Payer: BC Managed Care – PPO

## 2018-10-12 ENCOUNTER — Other Ambulatory Visit: Payer: Self-pay

## 2018-10-12 DIAGNOSIS — N83202 Unspecified ovarian cyst, left side: Secondary | ICD-10-CM | POA: Diagnosis not present

## 2018-10-12 DIAGNOSIS — I517 Cardiomegaly: Secondary | ICD-10-CM | POA: Diagnosis not present

## 2018-10-12 DIAGNOSIS — I10 Essential (primary) hypertension: Secondary | ICD-10-CM | POA: Diagnosis not present

## 2018-10-12 DIAGNOSIS — N83201 Unspecified ovarian cyst, right side: Secondary | ICD-10-CM | POA: Diagnosis not present

## 2018-10-12 DIAGNOSIS — R1031 Right lower quadrant pain: Secondary | ICD-10-CM | POA: Diagnosis not present

## 2018-10-12 DIAGNOSIS — N1330 Unspecified hydronephrosis: Secondary | ICD-10-CM | POA: Diagnosis not present

## 2018-10-12 DIAGNOSIS — D259 Leiomyoma of uterus, unspecified: Secondary | ICD-10-CM | POA: Diagnosis not present

## 2018-10-12 DIAGNOSIS — R102 Pelvic and perineal pain: Secondary | ICD-10-CM | POA: Diagnosis not present

## 2018-10-12 DIAGNOSIS — N83209 Unspecified ovarian cyst, unspecified side: Secondary | ICD-10-CM

## 2018-10-12 DIAGNOSIS — N83291 Other ovarian cyst, right side: Secondary | ICD-10-CM | POA: Diagnosis not present

## 2018-10-12 DIAGNOSIS — F17200 Nicotine dependence, unspecified, uncomplicated: Secondary | ICD-10-CM | POA: Diagnosis not present

## 2018-10-12 LAB — CBC WITH DIFFERENTIAL/PLATELET
Abs Immature Granulocytes: 0.07 10*3/uL (ref 0.00–0.07)
Basophils Absolute: 0.1 10*3/uL (ref 0.0–0.1)
Basophils Relative: 0 %
Eosinophils Absolute: 0 10*3/uL (ref 0.0–0.5)
Eosinophils Relative: 0 %
HCT: 41.4 % (ref 36.0–46.0)
Hemoglobin: 12.8 g/dL (ref 12.0–15.0)
Immature Granulocytes: 1 %
Lymphocytes Relative: 10 %
Lymphs Abs: 1.3 10*3/uL (ref 0.7–4.0)
MCH: 27.1 pg (ref 26.0–34.0)
MCHC: 30.9 g/dL (ref 30.0–36.0)
MCV: 87.7 fL (ref 80.0–100.0)
Monocytes Absolute: 0.6 10*3/uL (ref 0.1–1.0)
Monocytes Relative: 4 %
Neutro Abs: 10.8 10*3/uL — ABNORMAL HIGH (ref 1.7–7.7)
Neutrophils Relative %: 85 %
Platelets: 379 10*3/uL (ref 150–400)
RBC: 4.72 MIL/uL (ref 3.87–5.11)
RDW: 14.7 % (ref 11.5–15.5)
WBC: 12.7 10*3/uL — ABNORMAL HIGH (ref 4.0–10.5)
nRBC: 0 % (ref 0.0–0.2)

## 2018-10-12 LAB — COMPREHENSIVE METABOLIC PANEL
ALT: 12 U/L (ref 0–44)
AST: 15 U/L (ref 15–41)
Albumin: 4 g/dL (ref 3.5–5.0)
Alkaline Phosphatase: 43 U/L (ref 38–126)
Anion gap: 8 (ref 5–15)
BUN: 11 mg/dL (ref 6–20)
CO2: 25 mmol/L (ref 22–32)
Calcium: 8.7 mg/dL — ABNORMAL LOW (ref 8.9–10.3)
Chloride: 104 mmol/L (ref 98–111)
Creatinine, Ser: 0.85 mg/dL (ref 0.44–1.00)
GFR calc Af Amer: >60 mL/min (ref >60)
GFR calc non Af Amer: >60 mL/min (ref >60)
Glucose, Bld: 117 mg/dL — ABNORMAL HIGH (ref 70–99)
Potassium: 4.1 mmol/L (ref 3.5–5.1)
Sodium: 137 mmol/L (ref 135–145)
Total Bilirubin: 0.3 mg/dL (ref 0.3–1.2)
Total Protein: 7.6 g/dL (ref 6.5–8.1)

## 2018-10-12 LAB — URINALYSIS, ROUTINE W REFLEX MICROSCOPIC
Bilirubin Urine: NEGATIVE
Glucose, UA: NEGATIVE mg/dL
Ketones, ur: NEGATIVE mg/dL
Leukocytes,Ua: NEGATIVE
Nitrite: NEGATIVE
Protein, ur: 30 mg/dL — AB
Specific Gravity, Urine: 1.015 (ref 1.005–1.030)
pH: 7 (ref 5.0–8.0)

## 2018-10-12 LAB — LIPASE, BLOOD: Lipase: 22 U/L (ref 11–51)

## 2018-10-12 LAB — I-STAT BETA HCG BLOOD, ED (MC, WL, AP ONLY): I-stat hCG, quantitative: <5 m[IU]/mL (ref <5)

## 2018-10-12 MED ORDER — IOHEXOL 300 MG/ML  SOLN
100.0000 mL | Freq: Once | INTRAMUSCULAR | Status: AC | PRN
  Administered 2018-10-12: 100 mL via INTRAVENOUS

## 2018-10-12 MED ORDER — SODIUM CHLORIDE (PF) 0.9 % IJ SOLN
INTRAMUSCULAR | Status: AC
  Filled 2018-10-12: qty 50

## 2018-10-12 MED ORDER — HYDROCHLOROTHIAZIDE 12.5 MG PO CAPS
25.0000 mg | ORAL_CAPSULE | Freq: Once | ORAL | Status: AC
  Administered 2018-10-12: 25 mg via ORAL
  Filled 2018-10-12: qty 2

## 2018-10-12 MED ORDER — MORPHINE SULFATE (PF) 4 MG/ML IV SOLN
4.0000 mg | Freq: Once | INTRAVENOUS | Status: AC
  Administered 2018-10-12: 4 mg via INTRAVENOUS
  Filled 2018-10-12: qty 1

## 2018-10-12 MED ORDER — IRBESARTAN 300 MG PO TABS
300.0000 mg | ORAL_TABLET | Freq: Once | ORAL | Status: AC
  Administered 2018-10-12: 300 mg via ORAL
  Filled 2018-10-12: qty 1

## 2018-10-12 MED ORDER — ONDANSETRON HCL 4 MG/2ML IJ SOLN
4.0000 mg | Freq: Once | INTRAMUSCULAR | Status: AC
  Administered 2018-10-12: 4 mg via INTRAVENOUS
  Filled 2018-10-12: qty 2

## 2018-10-12 MED ORDER — SODIUM CHLORIDE 0.9 % IV BOLUS
1000.0000 mL | Freq: Once | INTRAVENOUS | Status: AC
  Administered 2018-10-12: 1000 mL via INTRAVENOUS

## 2018-10-12 NOTE — ED Notes (Signed)
Patient transported to CT 

## 2018-10-12 NOTE — ED Provider Notes (Signed)
[facility] DEPT Provider Note   CSN: [id number] Arrival date & time: 10/12/18  1746    History   Chief Complaint Chief Complaint  Patient presents with   Abdominal Pain    HPI Kristi Bowen is a 33 y.o. female.     Kristi Bowen is a 33 y.o. female with a history of hypertension, who presents to the emergency department for evaluation of right lower quadrant abdominal pain.  Pain started yesterday evening and was initially mild but has worsened throughout the day.  She reports the pain radiates back towards her flank.  She did have one episode of vomiting this morning is continued to experience nausea.  Has not had any diarrhea or constipation.  She report some urinary frequency but no dysuria or hematuria.  She just completed her menstrual cycle and it was normal for her.  She reports some brown vaginal discharge which is typical at the end of her cycle.  She is sexually active with her husband.  Denies prior history of abdominal surgeries.  Has had one previous pregnancy which was uncomplicated.  Patient noted to be very hypertensive on arrival, reports she was previously on valsartan HCTZ but her doctor took her off of this medication due to improvement in her blood pressure, she reports she has an appointment with her PCP tomorrow morning to discuss this.     Past Medical History:  Diagnosis Date   Hypertension     There are no active problems to display for this patient.   History reviewed. No pertinent surgical history.   OB History   No obstetric history on file.      Home Medications    Prior to Admission medications   Medication Sig Start Date End Date Taking? Authorizing Provider  acetaminophen (TYLENOL) 500 MG tablet Take 1,000 mg by mouth every 6 (six) hours as needed for moderate pain.   Yes [provider]  albuterol (PROVENTIL HFA;VENTOLIN HFA) 108 (90 Base) MCG/ACT inhaler Inhale 2 puffs into the lungs  every 4 (four) hours as needed for wheezing or shortness of breath. Patient not taking: Reported on 12/22/2017 08/03/15   Horton, Barbette Hair, MD  benzonatate (TESSALON) 100 MG capsule Take 1 capsule (100 mg total) by mouth every 8 (eight) hours. Patient not taking: Reported on 12/22/2017 05/14/16   Frederica Kuster, PA-C  megestrol (MEGACE) 40 MG tablet 2 tabs three times a day for the next three days, then 2 tabs twice a day until seen by an obgyn Patient not taking: Reported on 10/12/2018 12/22/17   Deno Etienne, DO  morphine (MSIR) 15 MG tablet Take 1 tablet (15 mg total) by mouth every 4 (four) hours as needed for severe pain. Patient not taking: Reported on 10/12/2018 12/22/17   Deno Etienne, DO  ondansetron (ZOFRAN ODT) 4 MG disintegrating tablet 4mg  ODT q4 hours prn nausea/vomit Patient not taking: Reported on 10/12/2018 12/22/17   Deno Etienne, DO  ondansetron (ZOFRAN) 4 MG tablet Take 1 tablet (4 mg total) by mouth every 6 (six) hours. Patient not taking: Reported on 12/22/2017 05/14/16   Frederica Kuster, PA-C  oseltamivir (TAMIFLU) 75 MG capsule Take 1 capsule (75 mg total) by mouth every 12 (twelve) hours. Patient not taking: Reported on 12/22/2017 05/14/16   Frederica Kuster, PA-C  oxyCODONE-acetaminophen (PERCOCET) 5-325 MG tablet Take 1-2 tablets by mouth every 6 (six) hours as needed (for pain). Patient not taking: Reported on 12/22/2017 01/16/16   Molpus, Jenny Reichmann,  MD  tamsulosin (FLOMAX) 0.4 MG CAPS capsule Take one capsule daily until stone passes. Patient not taking: Reported on 12/22/2017 01/16/16   Molpus, Jenny Reichmann, MD    Family History No family history on file.  Social History Social History   Tobacco Use   Smoking status: Current Every Day Smoker   Smokeless tobacco: Never Used  Substance Use Topics   Alcohol use: No   Drug use: No     Allergies   Patient has no known allergies.   Review of Systems Review of Systems  Constitutional: Negative for chills and fever.  HENT: Negative.     Eyes: Negative for visual disturbance.  Respiratory: Negative for cough and shortness of breath.   Cardiovascular: Negative for chest pain.  Gastrointestinal: Positive for abdominal pain, nausea and vomiting. Negative for blood in stool, constipation and diarrhea.  Genitourinary: Positive for flank pain and frequency. Negative for dysuria, hematuria, pelvic pain, vaginal bleeding and vaginal discharge.  Musculoskeletal: Negative for arthralgias and myalgias.  Skin: Negative for color change and rash.  Neurological: Negative for dizziness, syncope and light-headedness.     Physical Exam Updated Vital Signs BP (!) 193/132 (BP Location: Right Wrist)    Pulse 87    Temp 98.9 F (37.2 C) (Oral)    Resp 17    Wt (!) 186 kg    LMP 10/05/2018    SpO2 100%    BMI 72.64 kg/m   Physical Exam Vitals signs and nursing note reviewed. Exam conducted with a chaperone present.  Constitutional:      General: She is not in acute distress.    Appearance: She is well-developed. She is obese. She is not diaphoretic.     Comments: Morbidly obese, sitting comfortably.  HENT:     Head: Normocephalic and atraumatic.     Mouth/Throat:     Mouth: Mucous membranes are moist.     Pharynx: Oropharynx is clear.  Eyes:     General:        Right eye: No discharge.        Left eye: No discharge.     Pupils: Pupils are equal, round, and reactive to light.  Neck:     Musculoskeletal: Neck supple.  Cardiovascular:     Rate and Rhythm: Normal rate and regular rhythm.     Heart sounds: Normal heart sounds.  Pulmonary:     Effort: Pulmonary effort is normal. No respiratory distress.     Breath sounds: Normal breath sounds. No wheezing or rales.     Comments: Respirations equal and unlabored, patient able to speak in full sentences, lungs clear to auscultation bilaterally Abdominal:     General: Bowel sounds are normal. There is no distension.     Palpations: Abdomen is soft. There is no mass.     Tenderness:  There is abdominal tenderness in the right lower quadrant. There is no guarding.     Comments: Abdomen soft, nondistended, bowel sounds present throughout, there is focal tenderness in the right lower quadrant without guarding there is also some tenderness over the right flank.   Musculoskeletal:        General: No deformity.  Skin:    General: Skin is warm and dry.     Capillary Refill: Capillary refill takes less than 2 seconds.  Neurological:     Mental Status: She is alert.     Coordination: Coordination normal.     Comments: Speech is clear, able to follow commands Moves extremities without  ataxia, coordination intact  Psychiatric:        Mood and Affect: Mood normal.        Behavior: Behavior normal.      ED Treatments / Results  Labs (all labs ordered are listed, but only abnormal results are displayed) Labs Reviewed  CBC WITH DIFFERENTIAL/PLATELET - Abnormal; Notable for the following components:      Result Value   WBC 12.7 (*)    Neutro Abs 10.8 (*)    All other components within normal limits  COMPREHENSIVE METABOLIC PANEL - Abnormal; Notable for the following components:   Glucose, Bld 117 (*)    Calcium 8.7 (*)    All other components within normal limits  URINALYSIS, ROUTINE W REFLEX MICROSCOPIC - Abnormal; Notable for the following components:   APPearance HAZY (*)    Hgb urine dipstick LARGE (*)    Protein, ur 30 (*)    Bacteria, UA RARE (*)    All other components within normal limits  URINE CULTURE  WET PREP, GENITAL  LIPASE, BLOOD  I-STAT BETA HCG BLOOD, ED ([facility], [facility], [facility] ONLY)  GC/CHLAMYDIA PROBE AMP ([place]) NOT AT [facility]    EKG None  Radiology Ct Abdomen Pelvis W Contrast  Addendum Date: 10/13/2018   ADDENDUM REPORT: 10/13/2018 00:49 ADDENDUM: Dilatation of the right collecting system may be secondary to extrinsic compression of the distal right ureter from the right ovarian mass. Electronically Signed   By: Constance Holster M.D.   On:  10/13/2018 00:49   Result Date: 10/13/2018 CLINICAL DATA:  Right lower quadrant abdominal pain x2 days. EXAM: CT ABDOMEN AND PELVIS WITH CONTRAST TECHNIQUE: Multidetector CT imaging of the abdomen and pelvis was performed using the standard protocol following bolus administration of intravenous contrast. CONTRAST:  156mL OMNIPAQUE IOHEXOL 300 MG/ML  SOLN COMPARISON:  CT dated 01/16/2016 FINDINGS: Lower chest: The heart is significantly enlarged. The lung bases are essentially clear. Hepatobiliary: There is hepatomegaly with hepatic steatosis. The gallbladder is unremarkable. Pancreas: Unremarkable. No pancreatic ductal dilatation or surrounding inflammatory changes. Spleen: Normal in size without focal abnormality. Adrenals/Urinary Tract: There is mild right-sided hydronephrosis. There is a calcification in the right hemipelvis measuring approximately 9 mm. This calcification demonstrates central hypoattenuating areas. Exact positioning of this calcification is difficult to determine, however it appears to be along the course of the distal right ureter. There are no radiopaque stones involving the left kidney. The bladder is unremarkable Stomach/Bowel: There is scattered colonic diverticula without CT evidence of diverticulitis. There is a normal appendix in the right lower quadrant. There is no small bowel obstruction. Vascular/Lymphatic: No significant vascular findings are present. No enlarged abdominal or pelvic lymph nodes. Reproductive: There are large bilateral adnexal masses measuring approximately 8.2 by 5.2 cm on the right and 8.9 by 4.5 cm on the left. The uterus is somewhat bulky and heterogeneous. Other: No abdominal wall hernia or abnormality. No abdominopelvic ascites. Musculoskeletal: No acute or significant osseous findings. IMPRESSION: 1. Examination limited by patient body habitus. 2. Mild right-sided hydroureteronephrosis. There is some enhancement of the right ureter. Again noted is a  calcification in the right hemipelvis measuring approximately 9 mm. While this could represent a partially obstructing distal right ureteral stone, it is not changed since 2017. In addition, there are central hypoattenuating areas within this calcifications suggesting it could represent a phlebolith. Appearance of the right collecting system could be secondary to an ascending urinary tract infection. Correlation with urinalysis is recommended. 3. Large bilateral ovarian masses.  Follow-up with a nonemergent outpatient ultrasound is recommended for further evaluation. 4. Significant cardiomegaly, much greater than expected for the patient's age. 5. Diverticulosis without CT evidence of diverticulitis. 6. Hepatomegaly with hepatic steatosis. Electronically Signed: By: Constance Holster M.D. On: 10/13/2018 00:25    Procedures Procedures (including critical care time)  Medications Ordered in ED Medications  sodium chloride (PF) 0.9 % injection (has no administration in time range)  cefTRIAXone (ROCEPHIN) 1 g in sodium chloride 0.9 % 100 mL IVPB (has no administration in time range)  ondansetron (ZOFRAN) injection 4 mg (4 mg Intravenous Given 10/12/18 2225)  sodium chloride 0.9 % bolus 1,000 mL (0 mLs Intravenous Stopped 10/13/18 0009)  morphine 4 MG/ML injection 4 mg (4 mg Intravenous Given 10/12/18 2226)  hydrochlorothiazide (MICROZIDE) capsule 25 mg (25 mg Oral Given 10/12/18 2309)  irbesartan (AVAPRO) tablet 300 mg (300 mg Oral Given 10/12/18 2310)  iohexol (OMNIPAQUE) 300 MG/ML solution 100 mL (100 mLs Intravenous Contrast Given 10/12/18 2319)     Initial Impression / Assessment and Plan / ED Course  I have reviewed the triage vital signs and the nursing notes.  Pertinent labs & imaging results that were available during my care of the patient were reviewed by me and considered in my medical decision making (see chart for details).  Patient presents to the emergency department for evaluation of  right lower quadrant pain starting yesterday, pain radiates towards her flank.  Has had some urinary frequency but no other urinary symptoms.  On arrival she is very hypertensive, initial blood pressure 232/167, was taken off of her blood pressure medications, vitals otherwise normal.  Will give dose of home BP meds here.  Will check abdominal labs and CT scan.  Labs significant for mild leukocytosis of 12.7 with slight left shift, normal hemoglobin, no acute electrolyte derangements, normal renal and liver function, normal lipase.  Urinalysis with large amount of hemoglobin with some red cells and rare bacteria present, urine culture sent.  Negative pregnancy.  CT scan shows bilateral ovarian masses, there is right-sided hydronephrosis with enhancement of the right proximal ureter concerning for possible infection, there is a calcification in this area but has been stable and present since 2017 and I favor this is more likely a phlebolith, I do question that the ovarian mass could potentially be pushing back on the ureter.  Enhancement suggests possible infection although urinalysis does not show severe infection, will treat with Rocephin and patient can likely go home on Keflex pending results of culture.  Incidental findings of enlarged heart as well, concerning that this could be related to patient's uncontrolled blood pressure, although she reports she seen a cardiologist for this before and they felt it was likely related to her obesity.  Given bilateral ovarian masses and continued right-sided pain, will get pelvic ultrasound to rule out torsion and also assess for PID.  Pelvic exam showed small amount of brown discharge at the end of her recent menstrual cycle, she did have some tenderness with cervical motion and and over the right adnexa.  I do think it is probably reasonable to treat for PID but will wait on ultrasound results.  Patient given additional dose of pain medication.  At shift change  care signed out to [place] who will follow-up on ultrasound results and disposition appropriately.  Final Clinical Impressions(s) / ED Diagnoses   Final diagnoses:  Right lower quadrant abdominal pain  Ovarian mass  Hydronephrosis, unspecified hydronephrosis type  Cardiomegaly  Hypertension, unspecified  type    ED Discharge Orders    None       Jacqlyn Larsen, [place] 10/13/18 8309    Fredia Sorrow, MD 10/15/18 [phone]

## 2018-10-12 NOTE — ED Notes (Signed)
Urine and culture sent to lab  

## 2018-10-12 NOTE — ED Triage Notes (Signed)
Pt states she is having RLQ pain and right lower back pain x 2 days. Denies N/V/D.

## 2018-10-13 ENCOUNTER — Emergency Department (HOSPITAL_COMMUNITY): Payer: BC Managed Care – PPO

## 2018-10-13 DIAGNOSIS — N83201 Unspecified ovarian cyst, right side: Secondary | ICD-10-CM | POA: Diagnosis not present

## 2018-10-13 DIAGNOSIS — N83202 Unspecified ovarian cyst, left side: Secondary | ICD-10-CM | POA: Diagnosis not present

## 2018-10-13 DIAGNOSIS — R102 Pelvic and perineal pain: Secondary | ICD-10-CM | POA: Diagnosis not present

## 2018-10-13 DIAGNOSIS — D259 Leiomyoma of uterus, unspecified: Secondary | ICD-10-CM | POA: Diagnosis not present

## 2018-10-13 LAB — WET PREP, GENITAL
Sperm: NONE SEEN
Trich, Wet Prep: NONE SEEN
Yeast Wet Prep HPF POC: NONE SEEN

## 2018-10-13 LAB — URINE CULTURE

## 2018-10-13 LAB — GC/CHLAMYDIA PROBE AMP (~~LOC~~) NOT AT ARMC
Chlamydia: NEGATIVE
Neisseria Gonorrhea: NEGATIVE

## 2018-10-13 MED ORDER — ONDANSETRON 4 MG PO TBDP: 4.0000 mg | ORAL_TABLET | Freq: Three times a day (TID) | ORAL | 0 refills | Status: DC | PRN

## 2018-10-13 MED ORDER — TRAMADOL HCL 50 MG PO TABS: 50.0000 mg | ORAL_TABLET | Freq: Four times a day (QID) | ORAL | 0 refills | Status: DC | PRN

## 2018-10-13 MED ORDER — MORPHINE SULFATE (PF) 4 MG/ML IV SOLN
4.0000 mg | Freq: Once | INTRAVENOUS | Status: AC
  Administered 2018-10-13: 4 mg via INTRAVENOUS
  Filled 2018-10-13: qty 1

## 2018-10-13 MED ORDER — OXYCODONE-ACETAMINOPHEN 5-325 MG PO TABS
1.0000 | ORAL_TABLET | Freq: Once | ORAL | Status: DC
  Filled 2018-10-13: qty 1

## 2018-10-13 MED ORDER — IBUPROFEN 800 MG PO TABS: 800.0000 mg | ORAL_TABLET | Freq: Three times a day (TID) | ORAL | 0 refills | Status: DC

## 2018-10-13 MED ORDER — SODIUM CHLORIDE 0.9 % IV SOLN
1.0000 g | Freq: Once | INTRAVENOUS | Status: DC
  Filled 2018-10-13: qty 10

## 2018-10-13 MED ORDER — CEFTRIAXONE SODIUM 1 G IJ SOLR
1.0000 g | Freq: Once | INTRAMUSCULAR | Status: AC
  Administered 2018-10-13: 1 g via INTRAMUSCULAR
  Filled 2018-10-13: qty 10

## 2018-10-13 MED ORDER — LIDOCAINE HCL 1 % IJ SOLN
INTRAMUSCULAR | Status: AC
  Administered 2018-10-13: 03:00:00
  Filled 2018-10-13: qty 20

## 2018-10-13 NOTE — ED Notes (Signed)
Pt verbalized discharge instructions and follow up care. Alert and ambulatory. No iv. Calling ride to take her home

## 2018-10-13 NOTE — ED Provider Notes (Signed)
33 year old female received at signout from [name] pending pelvic ultrasound. Per her HPI:   "Kristi Bowen is a 33 y.o. female with a history of hypertension, who presents to the emergency department for evaluation of right lower quadrant abdominal pain.  Pain started yesterday evening and was initially mild but has worsened throughout the day.  She reports the pain radiates back towards her flank.  She did have one episode of vomiting this morning is continued to experience nausea.  Has not had any diarrhea or constipation.  She report some urinary frequency but no dysuria or hematuria.  She just completed her menstrual cycle and it was normal for her.  She reports some brown vaginal discharge which is typical at the end of her cycle.  She is sexually active with her husband.  Denies prior history of abdominal surgeries.  Has had one previous pregnancy which was uncomplicated.  Patient noted to be very hypertensive on arrival, reports she was previously on valsartan HCTZ but her doctor took her off of this medication due to improvement in her blood pressure, she reports she has an appointment with her PCP tomorrow morning to discuss this."  Physical Exam  BP (!) 169/126   Pulse 81   Temp 98.9 F (37.2 C) (Oral)   Resp 16   Wt (!) 186 kg   LMP 10/05/2018   SpO2 100%   BMI 72.64 kg/m   Physical Exam Vitals signs and nursing note reviewed.  Constitutional:      General: She is not in acute distress.    Appearance: She is obese. She is not ill-appearing, toxic-appearing or diaphoretic.  HENT:     Head: Normocephalic.  Eyes:     Conjunctiva/sclera: Conjunctivae normal.  Neck:     Musculoskeletal: Neck supple.  Cardiovascular:     Rate and Rhythm: Normal rate and regular rhythm.     Heart sounds: No murmur. No friction rub. No gallop.   Pulmonary:     Effort: Pulmonary effort is normal. No respiratory distress.  Abdominal:     General: There is no distension.     Palpations:  Abdomen is soft.     Tenderness: There is abdominal tenderness.  Skin:    General: Skin is warm.     Findings: No rash.  Neurological:     Mental Status: She is alert.  Psychiatric:        Behavior: Behavior normal.     ED Course/Procedures     Procedures  MDM    33 year old female with a history of hypertension received at signout from [facility] for pending pelvic ultrasound.  Please see her note for further work-up and medical decision making.  Pelvic ultrasound demonstrating large bilateral ovarian hemorrhagic cyst. There is an 8.1 x 6.1 x 5.0 cm hemorrhagic cyst on the right. The hemorrhagic cyst on the left is almost the entire ovary, 10.0 x 6.6 x 6.3 cm.  There is also concern for suboptimal visualization of the left ovarian parenchyma with high resistance arterial flow in the periphery of the left ovary.  Spoke with radiologist Dr. [name] who felt that these findings may be secondary to mass-effect and compression of the ovary by the large hemorrhagic cyst versus left ovarian torsion.  Consulted [name] and spoke with Dr. [name] regarding the patient's symptoms and imaging findings.  Dr. [name] felt given the large size of the ovarian cyst that torsion was unlikely, and findings were likely secondary to mass-effect.  She recommended conservative management with  ibuprofen and heating pads with follow-up in the [name] clinic in 1 week.  Short course of narcotics could also be considered, but the mainstay of treatment should be ibuprofen.  These findings and treatment plan were discussed with the patient.  All questions answered.  Pain is currently controlled in the ER.  A 3-month prescription history query was performed using the [place] CSRS prior to discharge.  At this time, the patient is hemodynamically stable and in no acute distress.  Safe for discharge to home with outpatient follow-up.    [name] A, PA-C 10/13/18 0240    [name], MD 10/13/18 [phone]

## 2018-10-13 NOTE — Discharge Instructions (Signed)
Thank you for allowing me to care for you today in the Emergency Department.   Please continue to follow up with your primary care provider to manage your blood pressure.   Please call to schedule a follow-up appointment with the [name] practice.  They would like to see you in the clinic in approximately 1 week.   The [name] team wanted to strongly emphasize that it will take a couple of weeks with consistent management of your symptoms before your pain significantly improves or resolves.  You can take 1 tablet of ibuprofen with food once every 8 hours.  This will help with inflammation and pain.  Please take it with food so it does not upset your stomach.  Do not take other NSAID medications, such as Aleve, while taking this medication.  It is important that you take regular doses so that the medication stays in your body and helps to control your pain.  For severe, uncontrollable pain, you can take 1 tablet of tramadol.  Tramadol is a controlled substance.  You should not work or drive while taking this medication.  You should avoid other substances that may cause you to be drowsy while taking this medication.  This should only be taken if your pain is unbearable despite taking ibuprofen as described above.  You can apply warm compresses or heating pad for 15 to 20 minutes as frequently as needed.  Do not sleep with a heating pad on as it may cause burns.  This may also help with pain and swelling.  Let 1 tablet of Zofran dissolve in your tongue every 8 hours as needed for nausea or vomiting.  Return to the emergency department if you develop uncontrollable pain, severe vaginal bleeding, high fever with severe pain, if you develop a severe headache or chest pain when your blood pressure is significantly elevated, if you pass out, develop persistent vomiting despite taking Zofran, or other new, concerning symptoms.

## 2018-10-13 NOTE — ED Notes (Signed)
US at bedside

## 2018-10-19 ENCOUNTER — Encounter: Payer: BC Managed Care – PPO | Admitting: Obstetrics and Gynecology

## 2018-10-19 ENCOUNTER — Telehealth: Payer: Self-pay | Admitting: Obstetrics and Gynecology

## 2018-10-19 NOTE — Telephone Encounter (Signed)
Called the patient to confirm the appointment. The patient stated she has been trying to call our office to reschedule. She is only off on Monday's. Informed the patient of the next available for a Monday. Also mailing an appointment reminder.

## 2018-10-31 ENCOUNTER — Encounter: Payer: BC Managed Care – PPO | Admitting: Obstetrics and Gynecology

## 2018-11-27 ENCOUNTER — Telehealth: Payer: Self-pay

## 2018-11-27 ENCOUNTER — Encounter: Payer: Self-pay | Admitting: Obstetrics & Gynecology

## 2018-11-27 ENCOUNTER — Ambulatory Visit: Payer: BC Managed Care – PPO | Admitting: Obstetrics & Gynecology

## 2018-11-27 ENCOUNTER — Other Ambulatory Visit: Payer: Self-pay

## 2018-11-27 DIAGNOSIS — N979 Female infertility, unspecified: Secondary | ICD-10-CM | POA: Diagnosis not present

## 2018-11-27 DIAGNOSIS — N946 Dysmenorrhea, unspecified: Secondary | ICD-10-CM | POA: Diagnosis not present

## 2018-11-27 DIAGNOSIS — N83209 Unspecified ovarian cyst, unspecified side: Secondary | ICD-10-CM

## 2018-11-27 NOTE — Progress Notes (Signed)
Patient ID: Kristi Bowen, female   DOB: 03/19/1986, 34 y.o.   MRN: [id number]  Chief Complaint  Patient presents with  . Follow-up   ED visit 09/2018 HPI Kristi Bowen is a 33 y.o. female.  G1P1, Patient's last menstrual period was 11/16/2018 (exact date). She presented to ED 09/2018 with severe cramps. 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 She and her spouse of 10 years have no children together and he has no other children. [place] in June showed bilateral ovarian cysts possibly hemorrhagic. Similar findings were seen on [place] 2017.    HPI  Past Medical History:  Diagnosis Date  . Hypertension     No past surgical history on file.  No family history on file.  Social History Social History   Tobacco Use  . Smoking status: Current Every Day Smoker  . Smokeless tobacco: Never Used  Substance Use Topics  . Alcohol use: No  . Drug use: No    No Known Allergies  Current Outpatient Medications  Medication Sig Dispense Refill  . ibuprofen (ADVIL) 800 MG tablet Take 1 tablet (800 mg total) by mouth 3 (three) times daily. 21 tablet 0  . acetaminophen (TYLENOL) 500 MG tablet Take 1,000 mg by mouth every 6 (six) hours as needed for moderate pain.    Marland Kitchen ondansetron (ZOFRAN ODT) 4 MG disintegrating tablet Take 1 tablet (4 mg total) by mouth every 8 (eight) hours as needed. (Patient not taking: Reported on 11/27/2018) 20 tablet 0  . traMADol (ULTRAM) 50 MG tablet Take 1 tablet (50 mg total) by mouth every 6 (six) hours as needed. (Patient not taking: Reported on 11/27/2018) 5 tablet 0   No current facility-administered medications for this visit.     Review of Systems Review of Systems  Constitutional: Negative.   Respiratory: Negative.   Gastrointestinal: Negative.   Genitourinary: Positive for pelvic pain (cramps with menses). Negative for dyspareunia, menstrual problem, vaginal bleeding and vaginal discharge.    Blood pressure (!) 194/133, pulse 89,  height 5\' 3"  (1.6 m), weight (!) 408 lb 14.4 oz (185.5 kg), last menstrual period 11/16/2018.  Physical Exam Physical Exam Constitutional:      Appearance: Normal appearance. She is obese. She is not ill-appearing.  Cardiovascular:     Rate and Rhythm: Normal rate.  Pulmonary:     Effort: Pulmonary effort is normal.  Genitourinary:    Comments: deferred Skin:    General: Skin is warm and dry.  Neurological:     Mental Status: She is alert.  Psychiatric:        Mood and Affect: Mood normal.        Behavior: Behavior normal.     Data Reviewed CLINICAL DATA:  33 year old female with pelvic pain.  EXAM: TRANSABDOMINAL AND TRANSVAGINAL ULTRASOUND OF PELVIS  DOPPLER ULTRASOUND OF OVARIES  TECHNIQUE: Both transabdominal and transvaginal ultrasound examinations of the pelvis were performed. Transabdominal technique was performed for global imaging of the pelvis including uterus, ovaries, adnexal regions, and pelvic cul-de-sac.  It was necessary to proceed with endovaginal exam following the transabdominal exam to visualize the endometrium and ovaries. Color and duplex Doppler ultrasound was utilized to evaluate blood flow to the ovaries.  COMPARISON:  CT of the abdomen pelvis dated 10/12/2018 and pelvic ultrasound dated 01/16/2016  FINDINGS: Evaluation is very limited due to patient's body habitus.  Uterus  Measurements: 13.6 x 6.8 x 8.0 cm = volume: 385 mL. There is a 3.0 x 3.2 x  3.2 cm posterior body and a 2.2 x 2.4 x 2.4 cm anterior body fibroid.  Endometrium  The endometrium is poorly visualized.  Right ovary  Measurements: 9.2 x 7.6 x 7.3 cm = volume: 267 mL. There is an 8.1 x 6.1 x 5.0 cm cystic mass with multiple lace-like internal septations within the right ovary most consistent with a hemorrhagic cyst. There is associated mass effect and displacement and compression of the ovarian parenchyma to the periphery. Doppler images  demonstrate arterial and venous flow to the right ovary.  Left ovary  Measurements: 10.0 x 6.6 x 6.3 cm = volume: 220 mL. The left ovary is suboptimally visualized. There is a complex cystic lesion within the left ovary which occupies the majority of the ovary in volume. There is echogenic content within this lesion most consistent with a hemorrhagic cyst with blood clots. The ovarian parenchyma is not identified with certainty and likely compressed to the periphery. There is high resistant arterial flow in the periphery of the left ovary which may be related to mass effect and compression of the ovarian parenchyma secondary to large hemorrhagic cyst. However, an ovarian torsion is not entirely excluded. Clinical correlation and gynecology consult is recommended.  Other findings  No abnormal free fluid.  IMPRESSION: 1. Large bilateral ovarian hemorrhagic cysts. 2. Suboptimal visualization of the left ovarian parenchyma with high resistance arterial flow in the periphery of the left ovary. Although this may be secondary to mass effect and compression of the ovary by the large hemorrhagic cyst, torsion of the left ovary is not excluded. Clinical correlation and gynecology consult is advised.  These results were called by telephone at the time of interpretation on 10/13/2018 at 1:53 am to physician assistant Kristi Bowen, who verbally acknowledged these results.   Electronically Signed   By: Kristi Bowen M.D.   On: 10/13/2018 01:53 Pap 2018 negative  Assessment Patient Active Problem List   Diagnosis Date Noted  . Morbid obesity ([place]) 11/27/2018  . Infertility, female 11/27/2018  . Hemorrhagic ovarian cyst 11/27/2018  . Dysmenorrhea 11/27/2018     Plan Discussed findings and the need to assess for resolution of the cystic masses. They may be hemorrhagic or could be endometriosis. Repeat US in 2 weeks and discuss result via virtual visit after that. We  discussed possible hormonal management     Kristi Bowen 11/27/2018, 9:40 AM

## 2018-11-27 NOTE — Telephone Encounter (Signed)
Called pt to advise of [place] appointment on 12/05/18 @ 11am, & to arrive at 10:45 w/ full bladder.

## 2018-11-27 NOTE — Patient Instructions (Signed)
Ovarian Cyst     An ovarian cyst is a fluid-filled sac that forms on an ovary. The ovaries are small organs that produce eggs in women. Various types of cysts can form on the ovaries. Some may cause symptoms and require treatment. Most ovarian cysts go away on their own, are not cancerous (are benign), and do not cause problems. Common types of ovarian cysts include:  Functional (follicle) cysts. ? Occur during the menstrual cycle, and usually go away with the next menstrual cycle if you do not get pregnant. ? Usually cause no symptoms.  Endometriomas. ? Are cysts that form from the tissue that lines the uterus (endometrium). ? Are sometimes called "chocolate cysts" because they become filled with blood that turns brown. ? Can cause pain in the lower abdomen during intercourse and during your period.  Cystadenoma cysts. ? Develop from cells on the outside surface of the ovary. ? Can get very large and cause lower abdomen pain and pain with intercourse. ? Can cause severe pain if they twist or break open (rupture).  Dermoid cysts. ? Are sometimes found in both ovaries. ? May contain different kinds of body tissue, such as skin, teeth, hair, or cartilage. ? Usually do not cause symptoms unless they get very big.  Theca lutein cysts. ? Occur when too much of a certain hormone (human chorionic gonadotropin) is produced and overstimulates the ovaries to produce an egg. ? Are most common after having procedures used to assist with the conception of a baby (in vitro fertilization). What are the causes? Ovarian cysts may be caused by:  Ovarian hyperstimulation syndrome. This is a condition that can develop from taking fertility medicines. It causes multiple large ovarian cysts to form.  Polycystic ovarian syndrome (PCOS). This is a common hormonal disorder that can cause ovarian cysts, as well as problems with your period or fertility. What increases the risk? The following factors may  make you more likely to develop ovarian cysts:  Being overweight or obese.  Taking fertility medicines.  Taking certain forms of hormonal birth control.  Smoking. What are the signs or symptoms? Many ovarian cysts do not cause symptoms. If symptoms are present, they may include:  Pelvic pain or pressure.  Pain in the lower abdomen.  Pain during sex.  Abdominal swelling.  Abnormal menstrual periods.  Increasing pain with menstrual periods. How is this diagnosed? These cysts are commonly found during a routine pelvic exam. You may have tests to find out more about the cyst, such as:  Ultrasound.  X-ray of the pelvis.  CT scan.  MRI.  Blood tests. How is this treated? Many ovarian cysts go away on their own without treatment. Your health care provider may want to check your cyst regularly for 2-3 months to see if it changes. If you are in menopause, it is especially important to have your cyst monitored closely because menopausal women have a higher rate of ovarian cancer. When treatment is needed, it may include:  Medicines to help relieve pain.  A procedure to drain the cyst (aspiration).  Surgery to remove the whole cyst.  Hormone treatment or birth control pills. These methods are sometimes used to help dissolve a cyst. Follow these instructions at home:  Take over-the-counter and prescription medicines only as told by your health care provider.  Do not drive or use heavy machinery while taking prescription pain medicine.  Get regular pelvic exams and Pap tests as often as told by your health care provider.    Return to your normal activities as told by your health care provider. Ask your health care provider what activities are safe for you.  Do not use any products that contain nicotine or tobacco, such as cigarettes and e-cigarettes. If you need help quitting, ask your health care provider.  Keep all follow-up visits as told by your health care provider.  This is important. Contact a health care provider if:  Your periods are late, irregular, or painful, or they stop.  You have pelvic pain that does not go away.  You have pressure on your bladder or trouble emptying your bladder completely.  You have pain during sex.  You have any of the following in your abdomen: ? A feeling of fullness. ? Pressure. ? Discomfort. ? Pain that does not go away. ? Swelling.  You feel generally ill.  You become constipated.  You lose your appetite.  You develop severe acne.  You start to have more body hair and facial hair.  You are gaining weight or losing weight without changing your exercise and eating habits.  You think you may be pregnant. Get help right away if:  You have abdominal pain that is severe or gets worse.  You cannot eat or drink without vomiting.  You suddenly develop a fever.  Your menstrual period is much heavier than usual. This information is not intended to replace advice given to you by your health care provider. Make sure you discuss any questions you have with your health care provider. Document Released: 04/05/2005 Document Revised: 07/04/2017 Document Reviewed: 09/07/2015 Elsevier Patient Education  2020 [employer]  

## 2018-12-05 ENCOUNTER — Ambulatory Visit (HOSPITAL_COMMUNITY): Payer: BC Managed Care – PPO

## 2019-01-01 ENCOUNTER — Telehealth: Payer: BC Managed Care – PPO | Admitting: Obstetrics & Gynecology

## 2019-02-24 DIAGNOSIS — Z20828 Contact with and (suspected) exposure to other viral communicable diseases: Secondary | ICD-10-CM | POA: Diagnosis not present

## 2019-04-27 DIAGNOSIS — Z20828 Contact with and (suspected) exposure to other viral communicable diseases: Secondary | ICD-10-CM | POA: Diagnosis not present

## 2019-08-05 ENCOUNTER — Emergency Department (HOSPITAL_COMMUNITY): Payer: BC Managed Care – PPO

## 2019-08-05 ENCOUNTER — Other Ambulatory Visit: Payer: Self-pay

## 2019-08-05 ENCOUNTER — Encounter (HOSPITAL_COMMUNITY): Payer: Self-pay

## 2019-08-05 ENCOUNTER — Emergency Department (HOSPITAL_COMMUNITY)
Admission: EM | Admit: 2019-08-05 | Discharge: 2019-08-06 | Disposition: A | Discharge status: Home / Self Care | Payer: BC Managed Care – PPO | Attending: Emergency Medicine | Admitting: Emergency Medicine

## 2019-08-05 DIAGNOSIS — B9689 Other specified bacterial agents as the cause of diseases classified elsewhere: Secondary | ICD-10-CM | POA: Diagnosis not present

## 2019-08-05 DIAGNOSIS — N83201 Unspecified ovarian cyst, right side: Secondary | ICD-10-CM | POA: Diagnosis not present

## 2019-08-05 DIAGNOSIS — F17210 Nicotine dependence, cigarettes, uncomplicated: Secondary | ICD-10-CM | POA: Insufficient documentation

## 2019-08-05 DIAGNOSIS — I10 Essential (primary) hypertension: Secondary | ICD-10-CM | POA: Insufficient documentation

## 2019-08-05 DIAGNOSIS — Z79899 Other long term (current) drug therapy: Secondary | ICD-10-CM | POA: Diagnosis not present

## 2019-08-05 DIAGNOSIS — D259 Leiomyoma of uterus, unspecified: Secondary | ICD-10-CM | POA: Diagnosis not present

## 2019-08-05 DIAGNOSIS — R102 Pelvic and perineal pain: Secondary | ICD-10-CM | POA: Insufficient documentation

## 2019-08-05 DIAGNOSIS — N939 Abnormal uterine and vaginal bleeding, unspecified: Secondary | ICD-10-CM | POA: Diagnosis not present

## 2019-08-05 DIAGNOSIS — N760 Acute vaginitis: Secondary | ICD-10-CM | POA: Diagnosis not present

## 2019-08-05 LAB — CBC WITH DIFFERENTIAL/PLATELET
Abs Immature Granulocytes: 0.05 10*3/uL (ref 0.00–0.07)
Basophils Absolute: 0 10*3/uL (ref 0.0–0.1)
Basophils Relative: 0 %
Eosinophils Absolute: 0 10*3/uL (ref 0.0–0.5)
Eosinophils Relative: 0 %
HCT: 33.9 % — ABNORMAL LOW (ref 36.0–46.0)
Hemoglobin: 10.3 g/dL — ABNORMAL LOW (ref 12.0–15.0)
Immature Granulocytes: 0 %
Lymphocytes Relative: 15 %
Lymphs Abs: 1.6 10*3/uL (ref 0.7–4.0)
MCH: 24.6 pg — ABNORMAL LOW (ref 26.0–34.0)
MCHC: 30.4 g/dL (ref 30.0–36.0)
MCV: 81.1 fL (ref 80.0–100.0)
Monocytes Absolute: 0.9 10*3/uL (ref 0.1–1.0)
Monocytes Relative: 8 %
Neutro Abs: 8.6 10*3/uL — ABNORMAL HIGH (ref 1.7–7.7)
Neutrophils Relative %: 77 %
Platelets: 422 10*3/uL — ABNORMAL HIGH (ref 150–400)
RBC: 4.18 MIL/uL (ref 3.87–5.11)
RDW: 15.6 % — ABNORMAL HIGH (ref 11.5–15.5)
WBC: 11.2 10*3/uL — ABNORMAL HIGH (ref 4.0–10.5)
nRBC: 0 % (ref 0.0–0.2)

## 2019-08-05 LAB — COMPREHENSIVE METABOLIC PANEL
ALT: 12 U/L (ref 0–44)
AST: 15 U/L (ref 15–41)
Albumin: 3.7 g/dL (ref 3.5–5.0)
Alkaline Phosphatase: 40 U/L (ref 38–126)
Anion gap: 8 (ref 5–15)
BUN: 12 mg/dL (ref 6–20)
CO2: 25 mmol/L (ref 22–32)
Calcium: 8.4 mg/dL — ABNORMAL LOW (ref 8.9–10.3)
Chloride: 104 mmol/L (ref 98–111)
Creatinine, Ser: 1.07 mg/dL — ABNORMAL HIGH (ref 0.44–1.00)
GFR calc Af Amer: >60 mL/min (ref >60)
GFR calc non Af Amer: >60 mL/min (ref >60)
Glucose, Bld: 107 mg/dL — ABNORMAL HIGH (ref 70–99)
Potassium: 3.6 mmol/L (ref 3.5–5.1)
Sodium: 137 mmol/L (ref 135–145)
Total Bilirubin: 0.6 mg/dL (ref 0.3–1.2)
Total Protein: 7.1 g/dL (ref 6.5–8.1)

## 2019-08-05 LAB — I-STAT BETA HCG BLOOD, ED (MC, WL, AP ONLY): I-stat hCG, quantitative: <5 m[IU]/mL (ref <5)

## 2019-08-05 LAB — WET PREP, GENITAL
Sperm: NONE SEEN
Trich, Wet Prep: NONE SEEN
Yeast Wet Prep HPF POC: NONE SEEN

## 2019-08-05 MED ORDER — ONDANSETRON HCL 4 MG/2ML IJ SOLN
4.0000 mg | Freq: Once | INTRAMUSCULAR | Status: DC
  Filled 2019-08-05: qty 2

## 2019-08-05 MED ORDER — SODIUM CHLORIDE 0.9 % IV BOLUS
500.0000 mL | Freq: Once | INTRAVENOUS | Status: AC
  Administered 2019-08-05: 21:00:00 500 mL via INTRAVENOUS

## 2019-08-05 MED ORDER — METRONIDAZOLE 500 MG PO TABS: 500.0000 mg | ORAL_TABLET | Freq: Two times a day (BID) | ORAL | 0 refills | Status: DC

## 2019-08-05 MED ORDER — ACETAMINOPHEN 500 MG PO TABS
1000.0000 mg | ORAL_TABLET | Freq: Once | ORAL | Status: AC
  Administered 2019-08-05: 1000 mg via ORAL
  Filled 2019-08-05: qty 2

## 2019-08-05 NOTE — ED Triage Notes (Signed)
Pt bleeding for 07/24/2019. Large blood clots since Friday. Started experiencing severe RLQ.

## 2019-08-05 NOTE — ED Provider Notes (Signed)
Vag bleeding since 4/6 2 days ago with pain in RLQ Getting [place] to torsion, TOA No fever, mild leukocytosis, no significant anemia No history of abnormal periods.    If [place] negative, consider CT r/o appy  [place] confirms right ovarian cyst felt to adequately explain the patient's pain. No CT is felt indicated. Discussed return precautions.   Will treat pain and discharge home. Refer to GYN for further management.    Charlann Lange, PA-C 08/06/19 [id number]    Dorie Rank, MD 08/06/19 1045

## 2019-08-05 NOTE — Discharge Instructions (Addendum)
As discussed, follow up with [facility] for further management of painful ovarian cyst, abnormal vaginal bleeding and uterine fibroid.   Return to the ED with any new or worsening symptoms - high fever, uncontrolled pain.

## 2019-08-05 NOTE — ED Provider Notes (Signed)
[facility] DEPT Provider Note   CSN: [id number] Arrival date & time: 08/05/19  1911     History Chief Complaint  Patient presents with  . Vaginal Bleeding    Kristi Bowen is a 34 y.o. female.  HPI      Kristi Bowen is a 34 y.o. female, with a history of HTN, presenting to the ED with abnormal vaginal bleeding beginning April 6. Patient states she was supposed to start her menstrual cycle April 10, however, it started early and she has had vaginal bleeding since then. She endorses using at least 3 pads or tampons daily. Beginning 2 days ago, she began to have right lower quadrant abdominal and pelvic pain.  Pain is constant, cramping, radiating toward the back, 8/10. Last normal bowel movement was 2 days ago with a hard bowel movement yesterday.  Occasional nausea.  She also notes decreased appetite for the last few days.  Denies fever/chills, chest pain, shortness of breath, urinary symptoms, vomiting, diarrhea, hematochezia/melena, dizziness, or any other complaints.  Past Medical History:  Diagnosis Date  . Hypertension     Patient Active Problem List   Diagnosis Date Noted  . Morbid obesity ([place]) 11/27/2018  . Infertility, female 11/27/2018  . Hemorrhagic ovarian cyst 11/27/2018  . Dysmenorrhea 11/27/2018    History reviewed. No pertinent surgical history.   OB History   No obstetric history on file.     No family history on file.  Social History   Tobacco Use  . Smoking status: Current Every Day Smoker  . Smokeless tobacco: Never Used  Substance Use Topics  . Alcohol use: No  . Drug use: No    Home Medications Prior to Admission medications   Medication Sig Start Date End Date Taking? Authorizing Provider  acetaminophen (TYLENOL) 500 MG tablet Take 1,000 mg by mouth every 6 (six) hours as needed for moderate pain.    [provider]  ibuprofen (ADVIL) 800 MG tablet Take 1 tablet (800 mg total) by  mouth 3 (three) times daily. 10/13/18   McDonald, Mia A, PA-C  ondansetron (ZOFRAN ODT) 4 MG disintegrating tablet Take 1 tablet (4 mg total) by mouth every 8 (eight) hours as needed. Patient not taking: Reported on 11/27/2018 10/13/18   McDonald, Maree Erie A, PA-C  traMADol (ULTRAM) 50 MG tablet Take 1 tablet (50 mg total) by mouth every 6 (six) hours as needed. Patient not taking: Reported on 11/27/2018 10/13/18   McDonald, Mia A, PA-C  albuterol (PROVENTIL HFA;VENTOLIN HFA) 108 (90 Base) MCG/ACT inhaler Inhale 2 puffs into the lungs every 4 (four) hours as needed for wheezing or shortness of breath. Patient not taking: Reported on 12/22/2017 08/03/15 10/13/18  Horton, Barbette Hair, MD    Allergies    Patient has no known allergies.  Review of Systems   Review of Systems  Constitutional: Positive for appetite change. Negative for chills and fever.  Respiratory: Negative for cough and shortness of breath.   Cardiovascular: Negative for chest pain.  Gastrointestinal: Positive for abdominal pain, constipation and nausea. Negative for diarrhea and vomiting.  Genitourinary: Positive for vaginal bleeding. Negative for dysuria and frequency.  Neurological: Negative for dizziness, syncope and weakness.  All other systems reviewed and are negative.   Physical Exam Updated Vital Signs BP (!) 242/156 (BP Location: Right Arm)   Pulse (!) 105   Temp 98.2 F (36.8 C) (Oral)   Resp 18   Ht 5\' 3"  (1.6 m)   Wt Marland Kitchen)  190.1 kg   SpO2 100%   BMI 74.22 kg/m   Physical Exam Vitals and nursing note reviewed. Exam conducted with a chaperone present.  Constitutional:      General: She is not in acute distress.    Appearance: She is well-developed. She is obese. She is not diaphoretic.  HENT:     Head: Normocephalic and atraumatic.     Mouth/Throat:     Mouth: Mucous membranes are moist.     Pharynx: Oropharynx is clear.  Eyes:     Conjunctiva/sclera: Conjunctivae normal.  Cardiovascular:     Rate and Rhythm:  Normal rate and regular rhythm.     Pulses: Normal pulses.          Radial pulses are 2+ on the right side and 2+ on the left side.       Posterior tibial pulses are 2+ on the right side and 2+ on the left side.     Heart sounds: Normal heart sounds.     Comments: Tactile temperature in the extremities appropriate and equal bilaterally. Pulmonary:     Effort: Pulmonary effort is normal. No respiratory distress.     Breath sounds: Normal breath sounds.  Abdominal:     Palpations: Abdomen is soft.     Tenderness: There is abdominal tenderness in the right lower quadrant. There is no right CVA tenderness, left CVA tenderness or guarding.    Genitourinary:    Comments: External genitalia normal Vagina with blood in the vaginal vault, appears to be coming from the cervical os.  Some small clots, no larger than a dime. Cervix  normal negative for cervical motion tenderness Adnexa palpated, no masses, positive for tenderness noted on the right Bladder palpated negative for tenderness Uterus palpated no masses, positive for tenderness  No inguinal lymphadenopathy. Otherwise normal female genitalia. RN, Luz Lex, served as chaperone during exam. Musculoskeletal:     Cervical back: Neck supple.     Right lower leg: No edema.     Left lower leg: No edema.  Lymphadenopathy:     Cervical: No cervical adenopathy.  Skin:    General: Skin is warm and dry.  Neurological:     Mental Status: She is alert.  Psychiatric:        Mood and Affect: Mood and affect normal.        Speech: Speech normal.        Behavior: Behavior normal.     ED Results / Procedures / Treatments   Labs (all labs ordered are listed, but only abnormal results are displayed) Labs Reviewed  WET PREP, GENITAL - Abnormal; Notable for the following components:      Result Value   Clue Cells Wet Prep HPF POC PRESENT (*)    WBC, Wet Prep HPF POC FEW (*)    All other components within normal limits  CBC WITH  DIFFERENTIAL/PLATELET - Abnormal; Notable for the following components:   WBC 11.2 (*)    Hemoglobin 10.3 (*)    HCT 33.9 (*)    MCH 24.6 (*)    RDW 15.6 (*)    Platelets 422 (*)    Neutro Abs 8.6 (*)    All other components within normal limits  COMPREHENSIVE METABOLIC PANEL - Abnormal; Notable for the following components:   Glucose, Bld 107 (*)    Creatinine, Ser 1.07 (*)    Calcium 8.4 (*)    All other components within normal limits  RPR  URINALYSIS, ROUTINE W REFLEX MICROSCOPIC  HIV ANTIBODY (ROUTINE TESTING W REFLEX)  I-STAT BETA HCG BLOOD, ED ([facility], [facility], [facility] ONLY)  GC/CHLAMYDIA PROBE AMP ([place]) NOT AT [facility]   Hemoglobin  Date Value Ref Range Status  08/05/2019 10.3 (L) 12.0 - 15.0 g/dL Final  10/12/2018 12.8 12.0 - 15.0 g/dL Final  12/22/2017 11.2 (L) 12.0 - 15.0 g/dL Final  01/16/2016 12.7 12.0 - 15.0 g/dL Final   BUN  Date Value Ref Range Status  08/05/2019 12 6 - 20 mg/dL Final  10/12/2018 11 6 - 20 mg/dL Final  12/22/2017 12 6 - 20 mg/dL Final  01/16/2016 9 6 - 20 mg/dL Final   Creatinine, Ser  Date Value Ref Range Status  08/05/2019 1.07 (H) 0.44 - 1.00 mg/dL Final  10/12/2018 0.85 0.44 - 1.00 mg/dL Final  12/22/2017 0.86 0.44 - 1.00 mg/dL Final  01/16/2016 0.78 0.44 - 1.00 mg/dL Final     EKG None  Radiology No results found.  Procedures Procedures (including critical care time)  Medications Ordered in ED Medications  ondansetron (ZOFRAN) injection 4 mg (has no administration in time range)  sodium chloride 0.9 % bolus 500 mL (0 mLs Intravenous Stopped 08/05/19 2217)  acetaminophen (TYLENOL) tablet 1,000 mg (1,000 mg Oral Given 08/05/19 2200)    ED Course  I have reviewed the triage vital signs and the nursing notes.  Pertinent labs & imaging results that were available during my care of the patient were reviewed by me and considered in my medical decision making (see chart for details).  Clinical Course as of Aug 05 2347  Sun Aug 05, 2019  2033 Declines analgesia at this time.   [SJ]  2127 Patient was taking what she thinks was amlodipine, however, has not been taking it for at least the last week because it was making her feel dizzy.  She has an appointment with her PCP tomorrow.  BP(!): 158/118 [SJ]  2152 Patient asking RN for something for pain, requests tylenol and nothing more.    [SJ]    Clinical Course User Index [SJ] Ellaree Gear C, PA-C   MDM Rules/Calculators/A&P                      Patient presents with several days of vaginal bleeding, however, she has a more acute complaint abdominal pain. Patient is nontoxic appearing, afebrile, not tachycardic on my exam, not tachypneic, not hypotensive, maintains excellent SPO2 on room air, and is in no apparent distress.   I reviewed and interpreted the patient's labs. She has some decrease in her hemoglobin, which is logical given her vaginal bleeding. Mild increase in her creatinine, suspected to be due to her poor oral intake over the last several days. Very mild leukocytosis.  Clue cells present on wet prep.  She states she has had bacterial vaginosis in the past and she also notes it has cleared after treatment.  I do not think this is at the root of her issues, however, we will address it.   End of shift patient care handoff report given to Charlann Lange, PA-C. Plan: Review ultrasound.  If results do not explain patient's right lower quadrant pain, suspect patient will need CT.  Vitals:   08/05/19 1918 08/05/19 1925 08/05/19 2115  BP: (!) 242/156  (!) 158/118  Pulse: (!) 105  92  Resp: 18  15  Temp: 98.2 F (36.8 C)    TempSrc: Oral    SpO2: 100%  98%  Weight: (!) 190.1 kg Marland Kitchen)  190.1 kg   Height: 5\' 3"  (1.6 m) 5\' 3"  (1.6 m)      Final Clinical Impression(s) / ED Diagnoses Final diagnoses:  None    Rx / DC Orders ED Discharge Orders    None       Layla Maw 08/05/19 2357    Dorie Rank, MD 08/06/19 1043

## 2019-08-06 DIAGNOSIS — D259 Leiomyoma of uterus, unspecified: Secondary | ICD-10-CM | POA: Diagnosis not present

## 2019-08-06 DIAGNOSIS — N83201 Unspecified ovarian cyst, right side: Secondary | ICD-10-CM | POA: Diagnosis not present

## 2019-08-06 LAB — GC/CHLAMYDIA PROBE AMP (~~LOC~~) NOT AT ARMC
Chlamydia: NEGATIVE
Comment: NEGATIVE
Comment: NORMAL
Neisseria Gonorrhea: NEGATIVE

## 2019-08-06 LAB — RPR: RPR Ser Ql: NONREACTIVE

## 2019-08-06 LAB — HIV ANTIBODY (ROUTINE TESTING W REFLEX): HIV Screen 4th Generation wRfx: NONREACTIVE

## 2019-08-06 MED ORDER — HYDROCODONE-ACETAMINOPHEN 5-325 MG PO TABS: 1.0000 | ORAL_TABLET | ORAL | 0 refills | Status: DC | PRN

## 2019-08-06 MED ORDER — IBUPROFEN 600 MG PO TABS: 600.0000 mg | ORAL_TABLET | Freq: Four times a day (QID) | ORAL | 0 refills | Status: DC | PRN

## 2019-08-06 MED ORDER — HYDROCODONE-ACETAMINOPHEN 5-325 MG PO TABS
1.0000 | ORAL_TABLET | Freq: Once | ORAL | Status: AC
  Administered 2019-08-06: 1 via ORAL
  Filled 2019-08-06: qty 1

## 2019-08-08 DIAGNOSIS — N83291 Other ovarian cyst, right side: Secondary | ICD-10-CM | POA: Diagnosis not present

## 2019-08-08 DIAGNOSIS — N920 Excessive and frequent menstruation with regular cycle: Secondary | ICD-10-CM | POA: Diagnosis not present

## 2019-08-31 ENCOUNTER — Other Ambulatory Visit: Payer: Self-pay | Admitting: Obstetrics and Gynecology

## 2019-09-03 DIAGNOSIS — N920 Excessive and frequent menstruation with regular cycle: Secondary | ICD-10-CM | POA: Diagnosis not present

## 2019-09-03 DIAGNOSIS — N83291 Other ovarian cyst, right side: Secondary | ICD-10-CM | POA: Diagnosis not present

## 2019-09-03 DIAGNOSIS — D259 Leiomyoma of uterus, unspecified: Secondary | ICD-10-CM | POA: Diagnosis not present

## 2019-09-03 DIAGNOSIS — N926 Irregular menstruation, unspecified: Secondary | ICD-10-CM | POA: Diagnosis not present

## 2019-09-03 DIAGNOSIS — E661 Drug-induced obesity: Secondary | ICD-10-CM | POA: Diagnosis not present

## 2019-09-24 DIAGNOSIS — D259 Leiomyoma of uterus, unspecified: Secondary | ICD-10-CM | POA: Diagnosis not present

## 2019-09-24 DIAGNOSIS — N83209 Unspecified ovarian cyst, unspecified side: Secondary | ICD-10-CM | POA: Diagnosis not present

## 2019-09-27 ENCOUNTER — Other Ambulatory Visit: Payer: Self-pay | Admitting: Obstetrics and Gynecology

## 2019-09-28 ENCOUNTER — Other Ambulatory Visit: Payer: Self-pay | Admitting: Obstetrics and Gynecology

## 2019-09-28 DIAGNOSIS — N9489 Other specified conditions associated with female genital organs and menstrual cycle: Secondary | ICD-10-CM

## 2019-10-12 ENCOUNTER — Encounter: Payer: Self-pay | Admitting: Radiology

## 2019-10-16 ENCOUNTER — Inpatient Hospital Stay: Admission: RE | Admit: 2019-10-16 | Payer: BC Managed Care – PPO | Source: Ambulatory Visit

## 2019-11-27 DIAGNOSIS — Z23 Encounter for immunization: Secondary | ICD-10-CM | POA: Diagnosis not present

## 2019-12-18 DIAGNOSIS — Z23 Encounter for immunization: Secondary | ICD-10-CM | POA: Diagnosis not present

## 2020-04-25 ENCOUNTER — Other Ambulatory Visit: Payer: Self-pay

## 2020-04-25 ENCOUNTER — Other Ambulatory Visit: Payer: BC Managed Care – PPO

## 2020-04-25 DIAGNOSIS — Z20822 Contact with and (suspected) exposure to covid-19: Secondary | ICD-10-CM

## 2020-04-29 LAB — NOVEL CORONAVIRUS, NAA: SARS-CoV-2, NAA: NOT DETECTED

## 2020-10-18 ENCOUNTER — Encounter (HOSPITAL_COMMUNITY): Payer: Self-pay | Admitting: Emergency Medicine

## 2020-10-18 ENCOUNTER — Other Ambulatory Visit: Payer: Self-pay

## 2020-10-18 ENCOUNTER — Emergency Department (HOSPITAL_COMMUNITY)
Admission: EM | Admit: 2020-10-18 | Discharge: 2020-10-18 | Disposition: A | Discharge status: Home / Self Care | Payer: BC Managed Care – PPO | Attending: Emergency Medicine | Admitting: Emergency Medicine

## 2020-10-18 DIAGNOSIS — K047 Periapical abscess without sinus: Secondary | ICD-10-CM

## 2020-10-18 DIAGNOSIS — K029 Dental caries, unspecified: Secondary | ICD-10-CM | POA: Insufficient documentation

## 2020-10-18 DIAGNOSIS — F17200 Nicotine dependence, unspecified, uncomplicated: Secondary | ICD-10-CM | POA: Insufficient documentation

## 2020-10-18 DIAGNOSIS — K0889 Other specified disorders of teeth and supporting structures: Secondary | ICD-10-CM

## 2020-10-18 MED ORDER — CLINDAMYCIN HCL 150 MG PO CAPS: 450.0000 mg | ORAL_CAPSULE | Freq: Three times a day (TID) | ORAL | 0 refills | Status: DC

## 2020-10-18 MED ORDER — CLINDAMYCIN HCL 300 MG PO CAPS
450.0000 mg | ORAL_CAPSULE | Freq: Once | ORAL | Status: AC
  Administered 2020-10-18: 450 mg via ORAL
  Filled 2020-10-18: qty 1

## 2020-10-18 NOTE — ED Triage Notes (Signed)
Patient complains of left lower dental pain that started 2 days ago with new facial swelling.

## 2020-10-18 NOTE — ED Provider Notes (Signed)
[facility] DEPT Provider Note   CSN: [id number] Arrival date & time: 10/18/20  0138     History Chief Complaint  Patient presents with   Dental Pain    Kristi Bowen is a 35 y.o. female presents to the Emergency Department complaining of gradual, persistent, progressively worsening left upper dental pain onset 3 days ago.  Patient reports she has had a cavity and broken tooth for some time at that site.  Reports she has an appointment with her dentist in October.  States that for the last 3 days she has had worsening pain and then tonight approximately 1 hour prior to arrival she awoke with left-sided facial swelling.  She has been taking ibuprofen PM but no other pain medications.  Denies fever, chills, changes in voice, difficulty swallowing.  Patient reports that eating and drinking make her symptoms worse.  Nothing seems to make them better.    The history is provided by the patient and medical records. No language interpreter was used.      History reviewed. No pertinent past medical history.  Patient Active Problem List   Diagnosis Date Noted   Morbid obesity ([place]) 11/27/2018   Infertility, female 11/27/2018   Hemorrhagic ovarian cyst 11/27/2018   Dysmenorrhea 11/27/2018    History reviewed. No pertinent surgical history.   OB History   No obstetric history on file.     No family history on file.  Social History   Tobacco Use   Smoking status: Every Day    Pack years: 0.00   Smokeless tobacco: Never  Substance Use Topics   Alcohol use: No   Drug use: No    Home Medications Prior to Admission medications   Medication Sig Start Date End Date Taking? Authorizing Provider  clindamycin (CLEOCIN) 150 MG capsule Take 3 capsules (450 mg total) by mouth 3 (three) times daily. 10/18/20  Yes Sylvan Sookdeo, Jarrett Soho, PA-C  acetaminophen (TYLENOL) 500 MG tablet Take 1,000 mg by mouth every 6 (six) hours as needed for moderate pain.     [provider]  HYDROcodone-acetaminophen (NORCO/VICODIN) 5-325 MG tablet Take 1 tablet by mouth every 4 (four) hours as needed for severe pain. 08/06/19   Charlann Lange, PA-C  ibuprofen (ADVIL) 600 MG tablet Take 1 tablet (600 mg total) by mouth every 6 (six) hours as needed. 08/06/19   Charlann Lange, PA-C  ondansetron (ZOFRAN ODT) 4 MG disintegrating tablet Take 1 tablet (4 mg total) by mouth every 8 (eight) hours as needed. Patient not taking: Reported on 11/27/2018 10/13/18   McDonald, Maree Erie A, PA-C  traMADol (ULTRAM) 50 MG tablet Take 1 tablet (50 mg total) by mouth every 6 (six) hours as needed. Patient not taking: Reported on 11/27/2018 10/13/18   McDonald, Mia A, PA-C  albuterol (PROVENTIL HFA;VENTOLIN HFA) 108 (90 Base) MCG/ACT inhaler Inhale 2 puffs into the lungs every 4 (four) hours as needed for wheezing or shortness of breath. Patient not taking: Reported on 12/22/2017 08/03/15 10/13/18  Horton, Barbette Hair, MD    Allergies    Patient has no known allergies.  Review of Systems   Review of Systems  Constitutional:  Negative for fatigue and fever.  HENT:  Positive for dental problem, ear pain and facial swelling.   Eyes:  Negative for visual disturbance.  Respiratory:  Negative for chest tightness.   Cardiovascular:  Negative for chest pain.  Gastrointestinal:  Negative for abdominal pain, nausea and vomiting.  Musculoskeletal:  Negative for neck pain and  neck stiffness.  Skin:  Negative for rash and wound.  Allergic/Immunologic: Negative for immunocompromised state.  Neurological:  Positive for headaches. Negative for dizziness.  Hematological:  Does not bruise/bleed easily.  Psychiatric/Behavioral:  The patient is not nervous/anxious.    Physical Exam Updated Vital Signs BP (!) 122/52 (BP Location: Left Arm)   Pulse (!) 109   Temp 98.5 F (36.9 C) (Oral)   Resp 20   Ht 5\' 3"  (1.6 m)   Wt (!) 192.8 kg   SpO2 99%   BMI 75.29 kg/m   Physical Exam Vitals and  nursing note reviewed.  Constitutional:      General: She is not in acute distress.    Appearance: She is well-developed. She is not ill-appearing.  HENT:     Head: Normocephalic.     Jaw: There is normal jaw occlusion.      Left Ear: Tympanic membrane normal.     Nose: Nose normal.     Mouth/Throat:     Dentition: Abnormal dentition.     Tongue: No lesions. Tongue does not deviate from midline.     Pharynx: No pharyngeal swelling.   Eyes:     General: No scleral icterus.    Conjunctiva/sclera: Conjunctivae normal.  Cardiovascular:     Rate and Rhythm: Normal rate.  Pulmonary:     Effort: Pulmonary effort is normal.  Abdominal:     General: There is no distension.  Musculoskeletal:        General: Normal range of motion.     Cervical back: Normal range of motion.  Skin:    General: Skin is warm and dry.  Neurological:     Mental Status: She is alert.  Psychiatric:        Mood and Affect: Mood normal.    ED Results / Procedures / Treatments     Procedures Procedures   Medications Ordered in ED Medications  clindamycin (CLEOCIN) capsule 450 mg (has no administration in time range)    ED Course  I have reviewed the triage vital signs and the nursing notes.  Pertinent labs & imaging results that were available during my care of the patient were reviewed by me and considered in my medical decision making (see chart for details).    MDM Rules/Calculators/A&P                          Patient with toothache.  No gross abscess.  Exam unconcerning for Ludwig's angina or spread of infection.  Will treat with clindamycin and anti-inflammatory medicine.  Urged patient to follow-up with dentist.    Final Clinical Impression(s) / ED Diagnoses Final diagnoses:  Pain, dental  Dental caries  Dental infection    Rx / DC Orders ED Discharge Orders          Ordered    clindamycin (CLEOCIN) 150 MG capsule  3 times daily        10/18/20 0213              Faatimah Spielberg, Gwenlyn Perking 10/18/20 0217    Cardama, Grayce Sessions, MD 10/18/20 1836

## 2020-10-18 NOTE — Discharge Instructions (Addendum)
1. Medications: Clindamycin; alternate tylenol and ibuprofen for pain control (800mg  ibuprofen 3x per day with food and Tylenol 1000mg  4x per day - do not exceed 4000mg  of tylenol in any 24 hour period), usual home medications  2. Treatment: rest, drink plenty of fluids, take medications as prescribed 3. Follow Up: Please followup with dentistry within 1 week for discussion of your diagnoses and further evaluation after today's visit; if you do not have a primary care doctor use the resource guide provided to find one; Return to the ER for high fevers, difficulty breathing, difficulty swallowing or other concerning symptoms

## 2022-01-31 IMAGING — US US PELVIS COMPLETE
1 series · 13 of 25 positions shown · non-contrast
Comparison: 01/16/2016

CLINICAL DATA: Right lower quadrant pain

EXAM:
TRANSABDOMINAL AND TRANSVAGINAL ULTRASOUND OF PELVIS
DOPPLER ULTRASOUND OF OVARIES
TECHNIQUE: Both transabdominal and transvaginal ultrasound examinations of the
pelvis were performed. Transabdominal technique was performed for
global imaging of the pelvis including uterus, ovaries, adnexal
regions, and pelvic cul-de-sac.
It was necessary to proceed with endovaginal exam following the
transabdominal exam to visualize the uterus, endometrium, ovaries
and adnexa. Color and duplex Doppler ultrasound was utilized to
evaluate blood flow to the ovaries.

[Series 1: us pelvis complete · 13 of 110 slices shown]
[im 1/110]
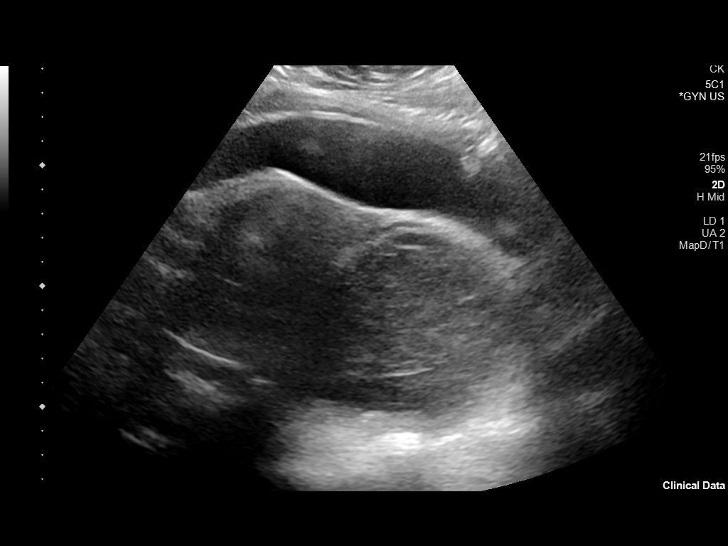
[im 10/110]
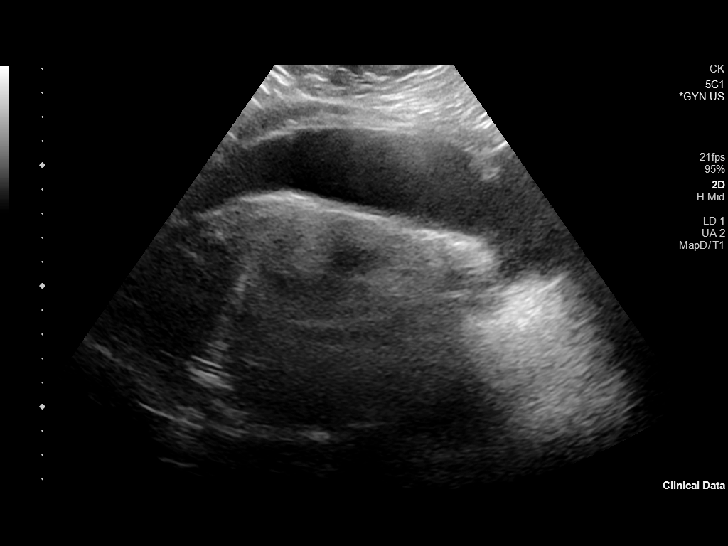
[im 19/110]
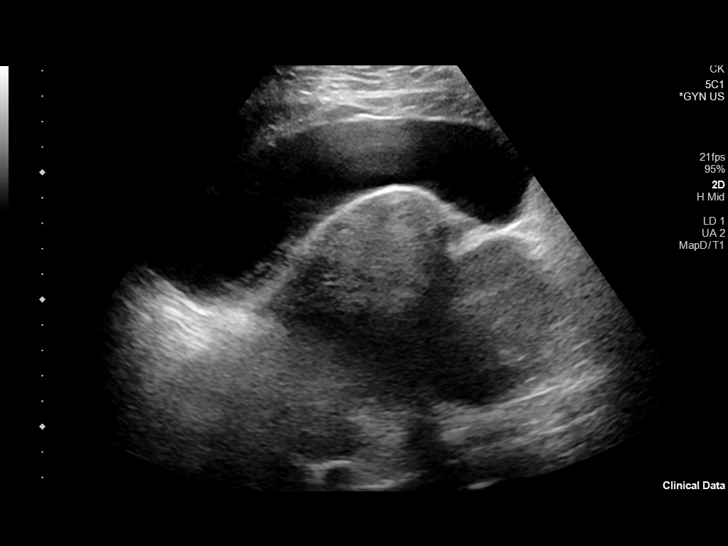
[im 28/110]
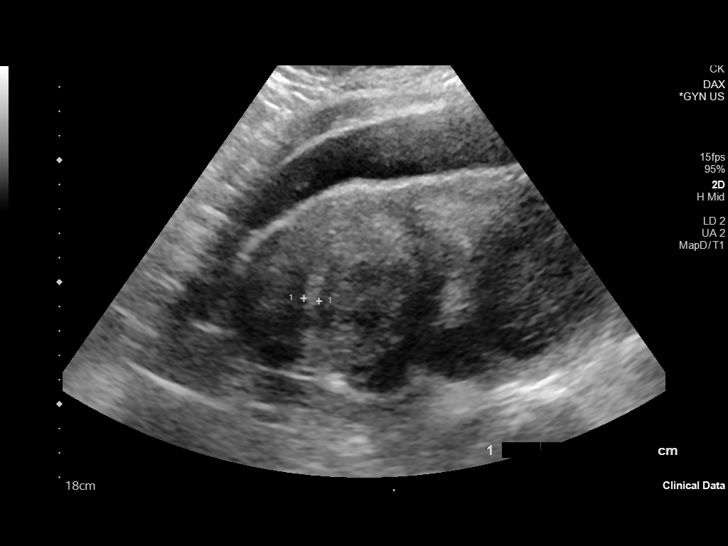
[im 37/110]
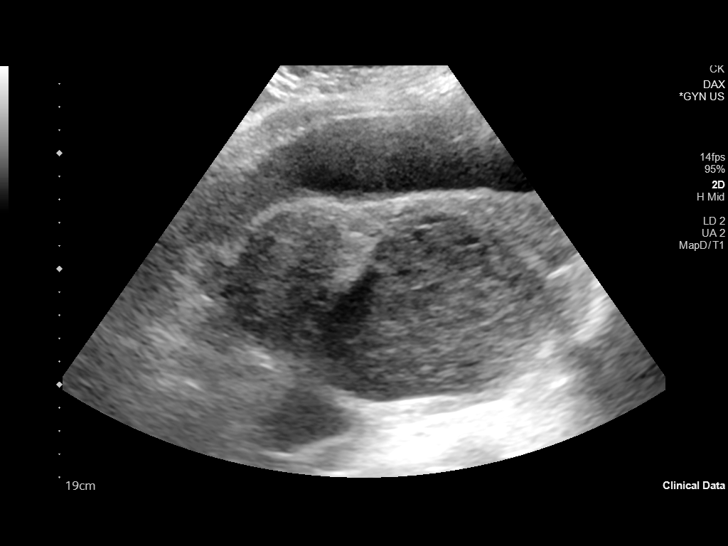
[im 46/110]
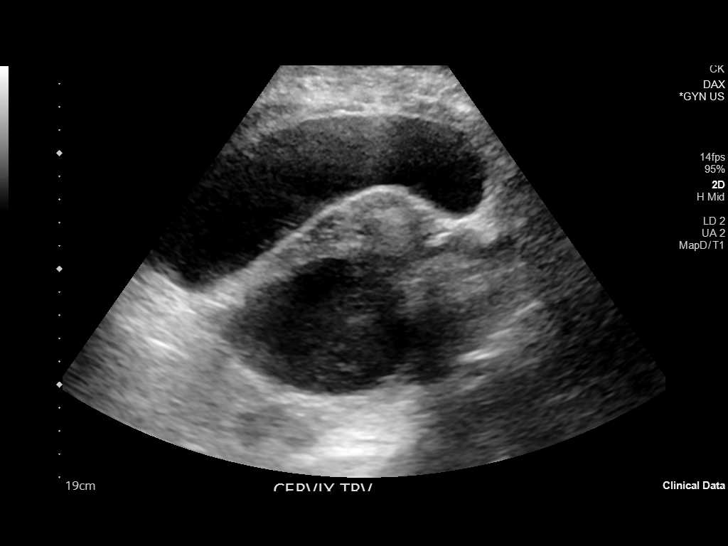
[im 55/110]
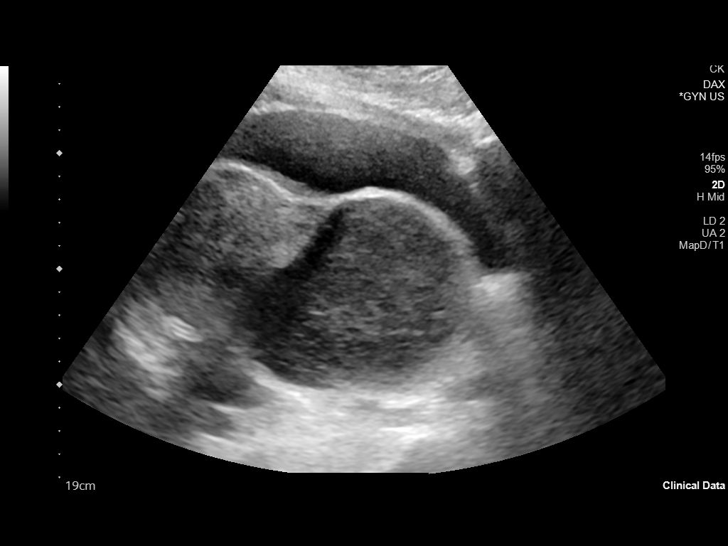
[im 64/110]
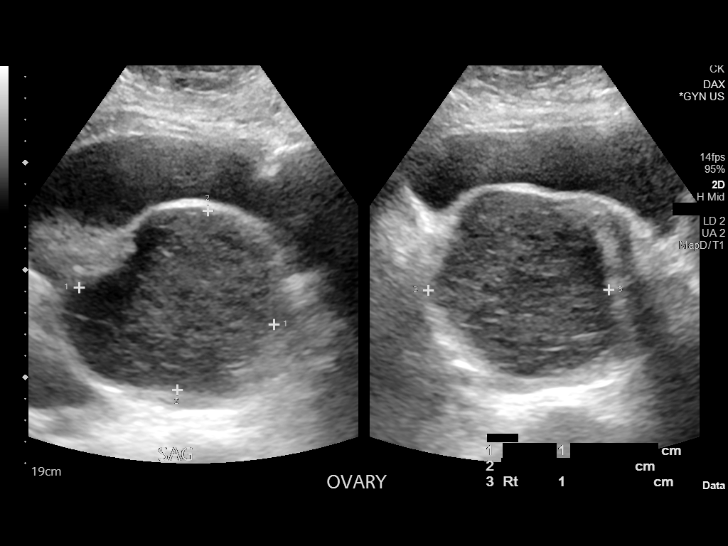
[im 73/110]
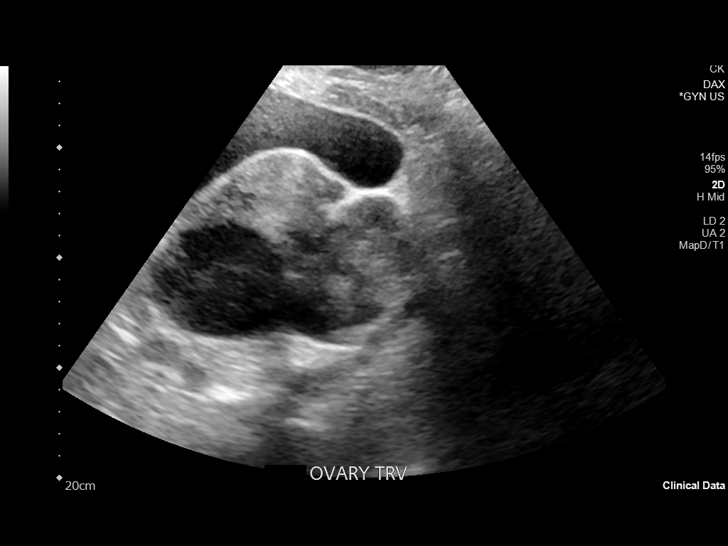
[im 82/110]
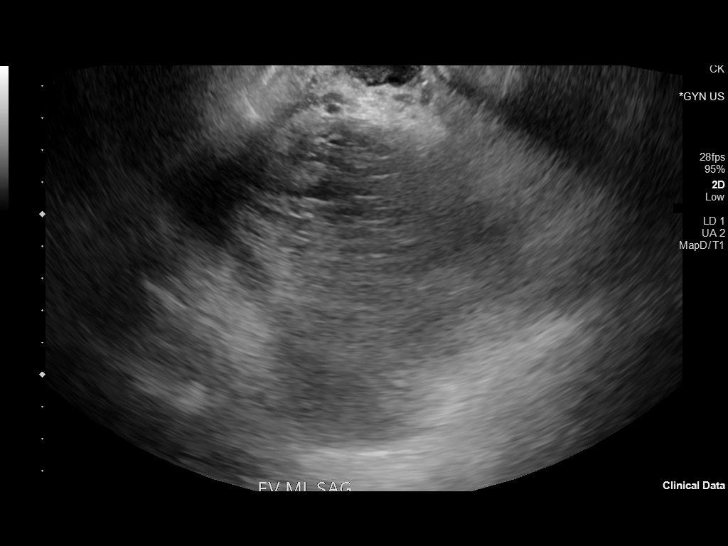
[im 91/110]
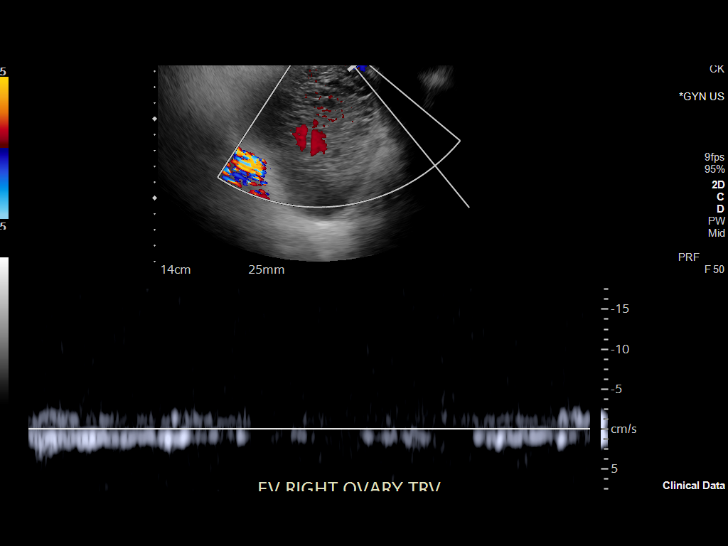
[im 100/110]
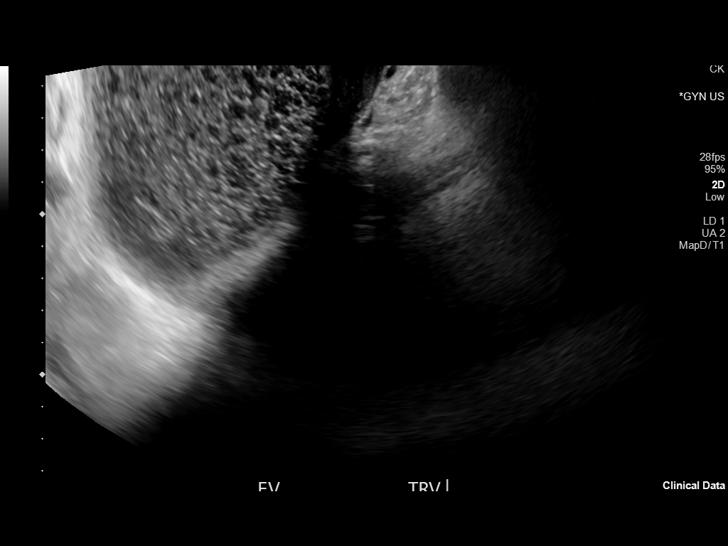
[im 110/110]
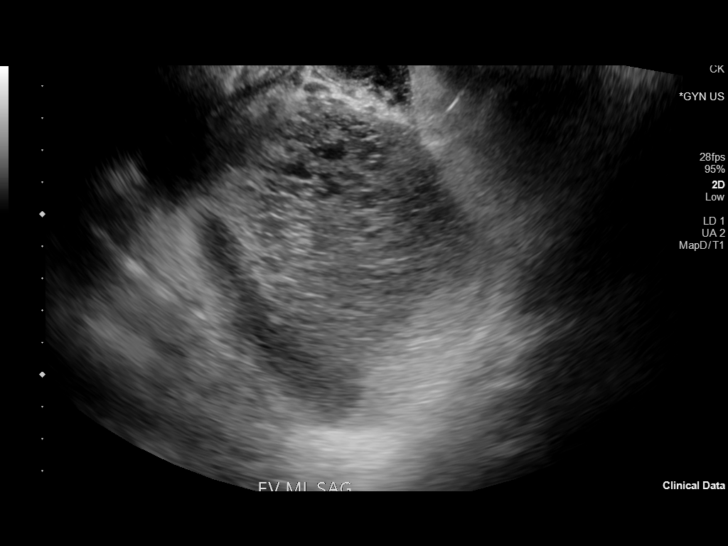

[13 of 25 positions shown; findings below may reference images not displayed]

FINDINGS: Uterus

Measurements: 15.2 x 7.9 x 7.9 cm = volume: 494 mL. Multiple small
fibroids, the largest 3.2 cm in the anterior fundus. Diffusely
heterogeneous echotexture.

Endometrium

Thickness: Normal thickness, 6 mm.  No focal abnormality visualized.

Right ovary

Measurements: 10.5 x 7.4 x 8.8 cm = volume: 355 mL. Complex right
ovarian cyst with diffuse internal echoes measures up to 9.2 cm.

Left ovary

Measurements: 7.8 x 6.9 x 5.6 cm. Volume 159 mL. No visible focal
abnormality.

Pulsed Doppler evaluation of both ovaries demonstrates normal
low-resistance arterial and venous waveforms in the right ovary.
Left ovary is difficult to visualize and evaluate due to body
habitus. Venous blood flow is documented, but unable to definitively
document arterial blood flow, possibly related to body habitus and
technical reasons.

Other findings

No abnormal free fluid.
IMPRESSION: Enlarged fibroid uterus.

Large complex cystic mass in the right ovary measuring up to 9.2 cm
with diffuse low level internal echoes. This could reflect
hemorrhagic cyst or endometrioma. Recommend follow-up ultrasound in
3-6 months to assess stability.

Left ovary difficult to interrogate rate with Doppler due to depth
and body habitus. Arterial blood flow cannot be definitively
visualized but venous blood flow is seen. Normal blood flow on the
right.

## 2022-01-31 IMAGING — US US TRANSVAGINAL NON-OB
1 series · 13 of 25 positions shown · non-contrast
Comparison: 01/16/2016

CLINICAL DATA: Right lower quadrant pain

EXAM:
TRANSABDOMINAL AND TRANSVAGINAL ULTRASOUND OF PELVIS
DOPPLER ULTRASOUND OF OVARIES
TECHNIQUE: Both transabdominal and transvaginal ultrasound examinations of the
pelvis were performed. Transabdominal technique was performed for
global imaging of the pelvis including uterus, ovaries, adnexal
regions, and pelvic cul-de-sac.
It was necessary to proceed with endovaginal exam following the
transabdominal exam to visualize the uterus, endometrium, ovaries
and adnexa. Color and duplex Doppler ultrasound was utilized to
evaluate blood flow to the ovaries.

[Series 1: us transvaginal non-ob · 13 of 110 slices shown]
[im 1/110]
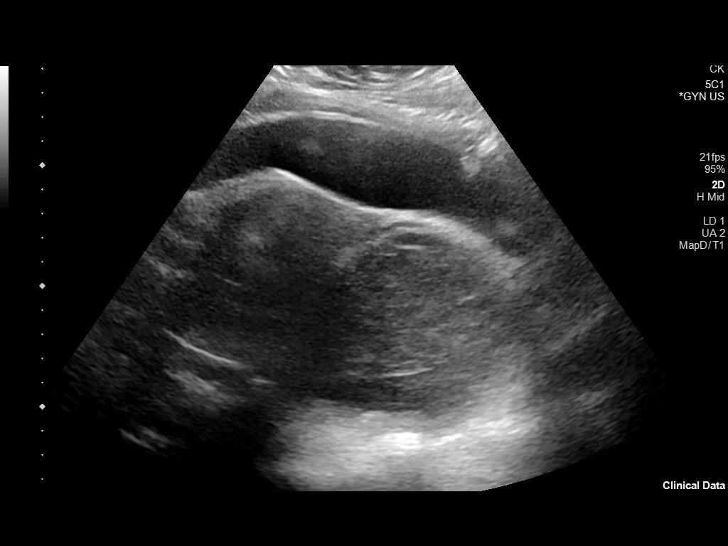
[im 10/110]
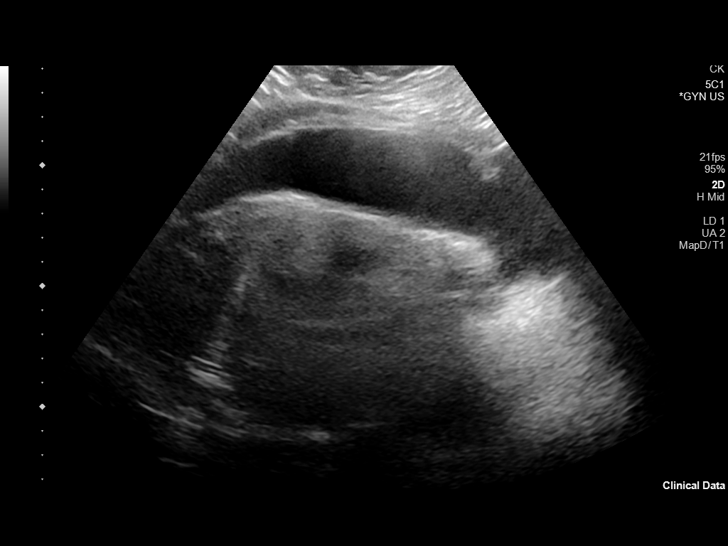
[im 19/110]
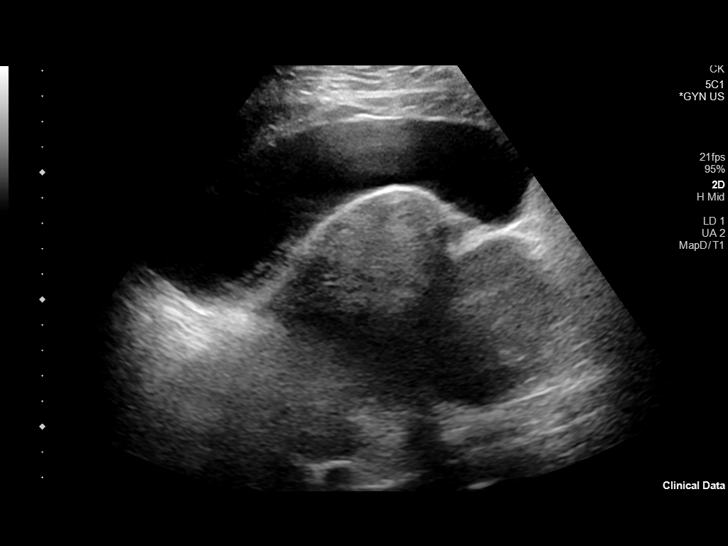
[im 28/110]
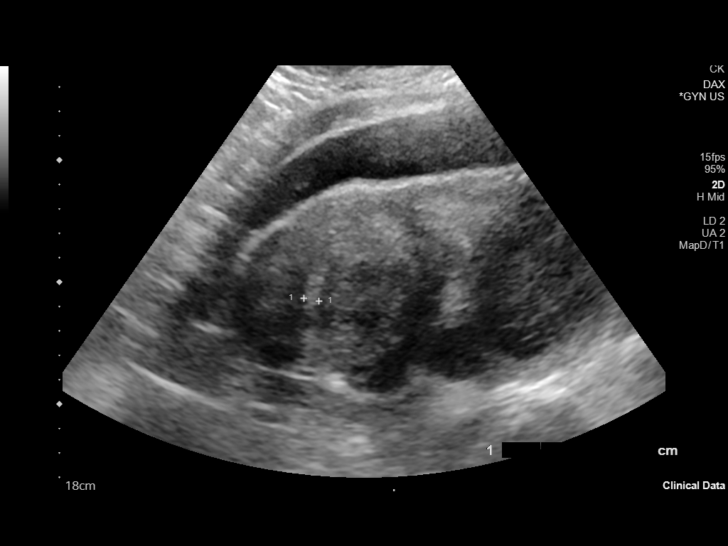
[im 37/110]
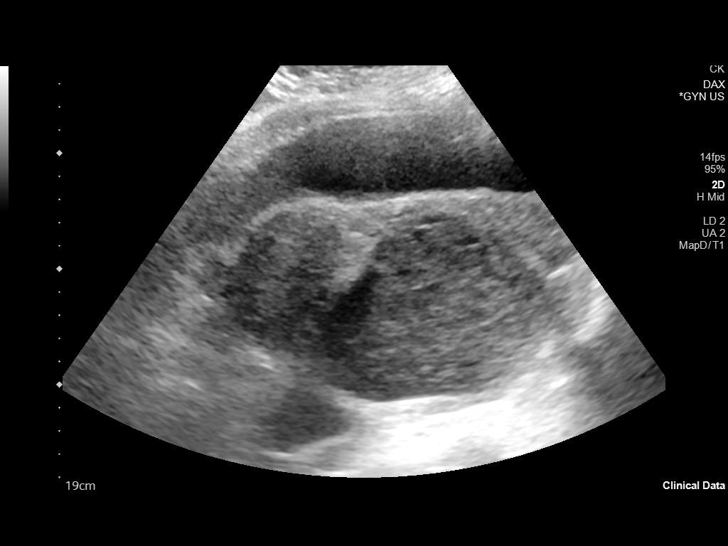
[im 46/110]
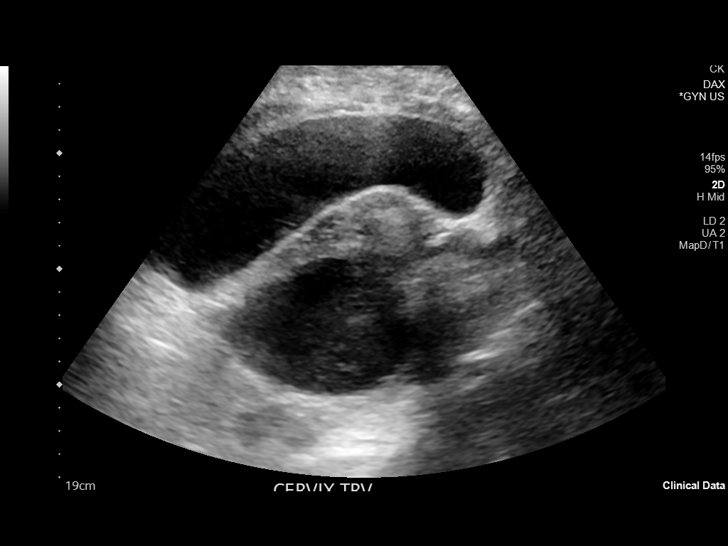
[im 55/110]
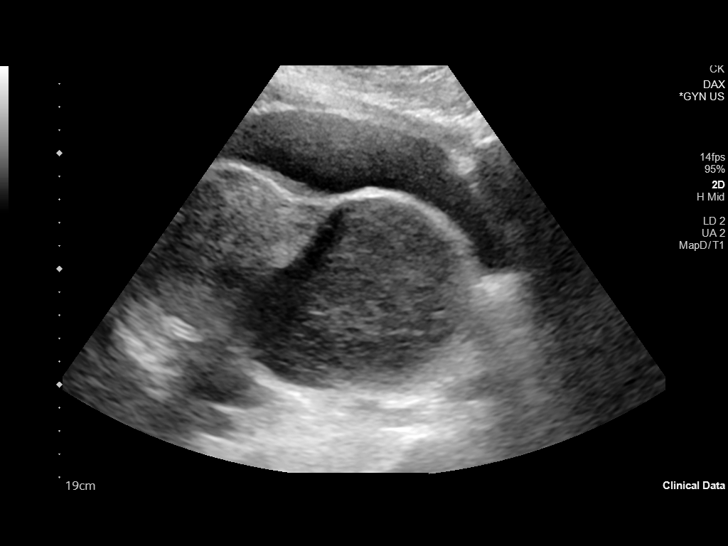
[im 64/110]
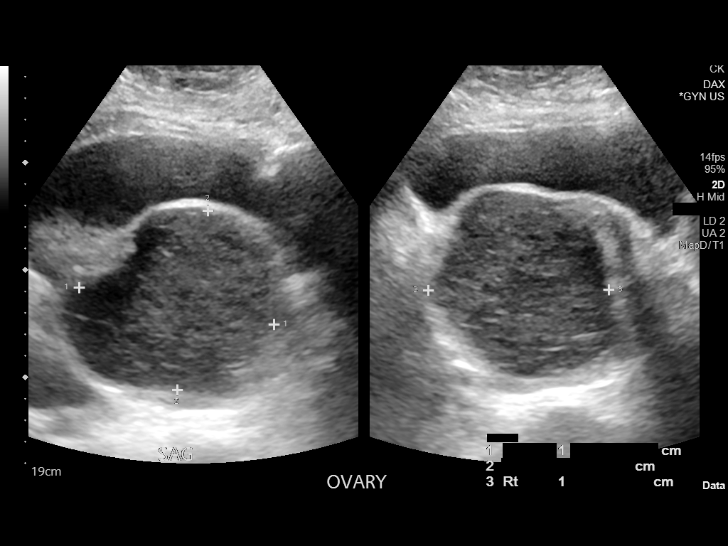
[im 73/110]
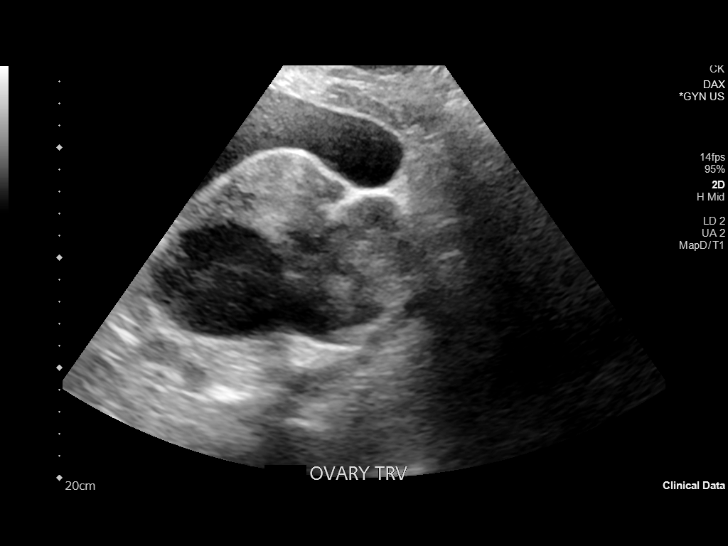
[im 82/110]
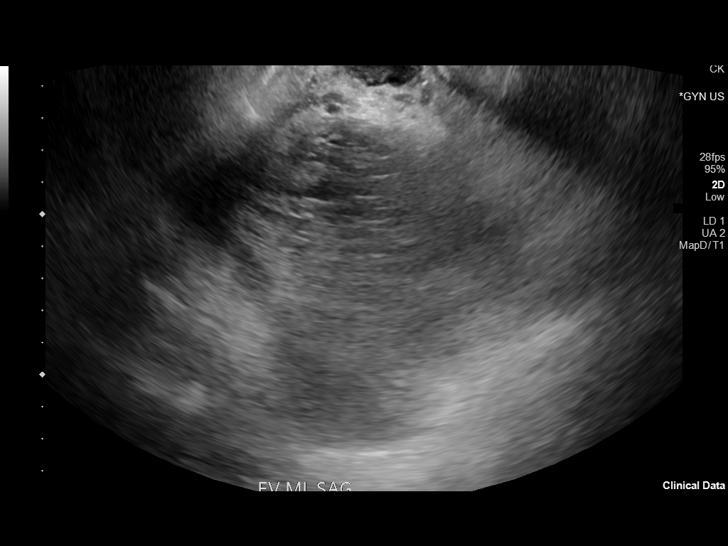
[im 91/110]
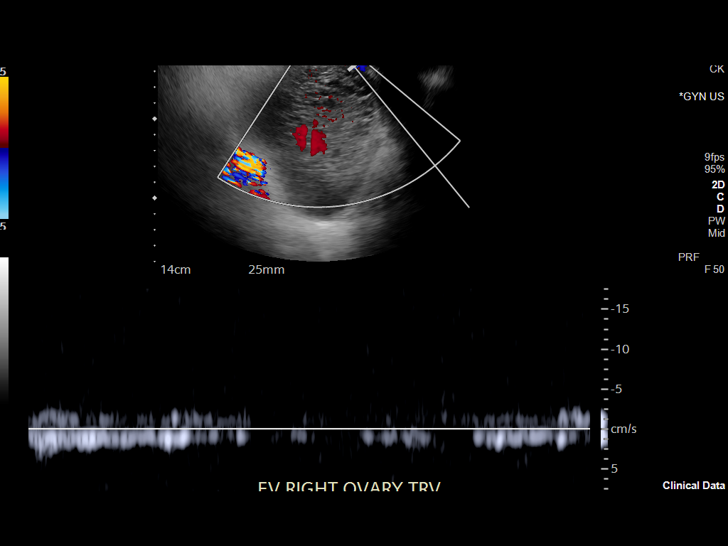
[im 100/110]
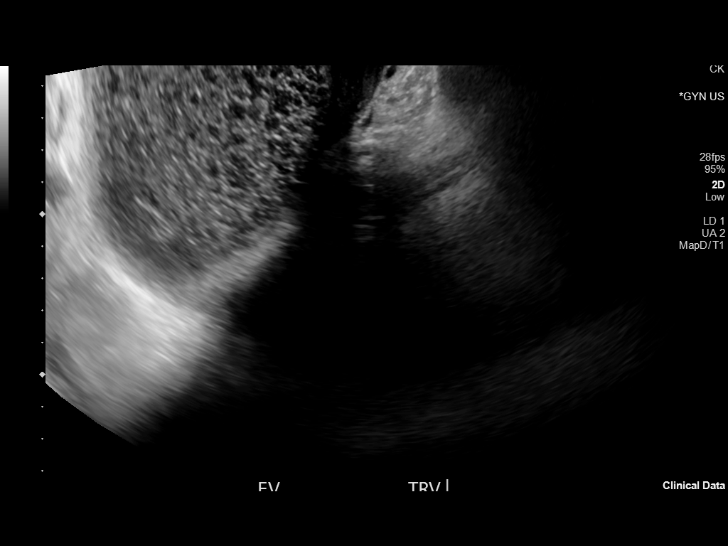
[im 110/110]
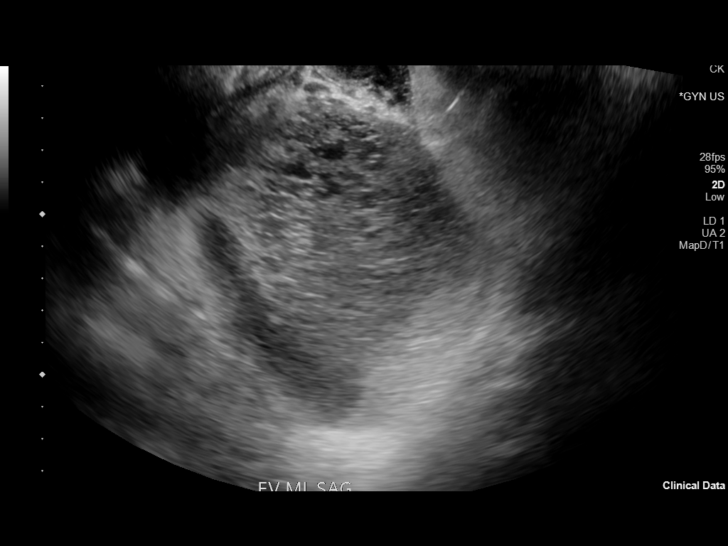

[13 of 25 positions shown; findings below may reference images not displayed]

FINDINGS: Uterus

Measurements: 15.2 x 7.9 x 7.9 cm = volume: 494 mL. Multiple small
fibroids, the largest 3.2 cm in the anterior fundus. Diffusely
heterogeneous echotexture.

Endometrium

Thickness: Normal thickness, 6 mm.  No focal abnormality visualized.

Right ovary

Measurements: 10.5 x 7.4 x 8.8 cm = volume: 355 mL. Complex right
ovarian cyst with diffuse internal echoes measures up to 9.2 cm.

Left ovary

Measurements: 7.8 x 6.9 x 5.6 cm. Volume 159 mL. No visible focal
abnormality.

Pulsed Doppler evaluation of both ovaries demonstrates normal
low-resistance arterial and venous waveforms in the right ovary.
Left ovary is difficult to visualize and evaluate due to body
habitus. Venous blood flow is documented, but unable to definitively
document arterial blood flow, possibly related to body habitus and
technical reasons.

Other findings

No abnormal free fluid.
IMPRESSION: Enlarged fibroid uterus.

Large complex cystic mass in the right ovary measuring up to 9.2 cm
with diffuse low level internal echoes. This could reflect
hemorrhagic cyst or endometrioma. Recommend follow-up ultrasound in
3-6 months to assess stability.

Left ovary difficult to interrogate rate with Doppler due to depth
and body habitus. Arterial blood flow cannot be definitively
visualized but venous blood flow is seen. Normal blood flow on the
right.

## 2022-01-31 IMAGING — US US ART/VEN ABD/PELV/SCROTUM DOPPLER LTD
1 series · 13 of 25 positions shown · non-contrast
Comparison: 01/16/2016

CLINICAL DATA: Right lower quadrant pain

EXAM:
TRANSABDOMINAL AND TRANSVAGINAL ULTRASOUND OF PELVIS
DOPPLER ULTRASOUND OF OVARIES
TECHNIQUE: Both transabdominal and transvaginal ultrasound examinations of the
pelvis were performed. Transabdominal technique was performed for
global imaging of the pelvis including uterus, ovaries, adnexal
regions, and pelvic cul-de-sac.
It was necessary to proceed with endovaginal exam following the
transabdominal exam to visualize the uterus, endometrium, ovaries
and adnexa. Color and duplex Doppler ultrasound was utilized to
evaluate blood flow to the ovaries.

[Series 1: us art/ven abd/pelv/scrotum doppler ltd · 13 of 110 slices shown]
[im 1/110]
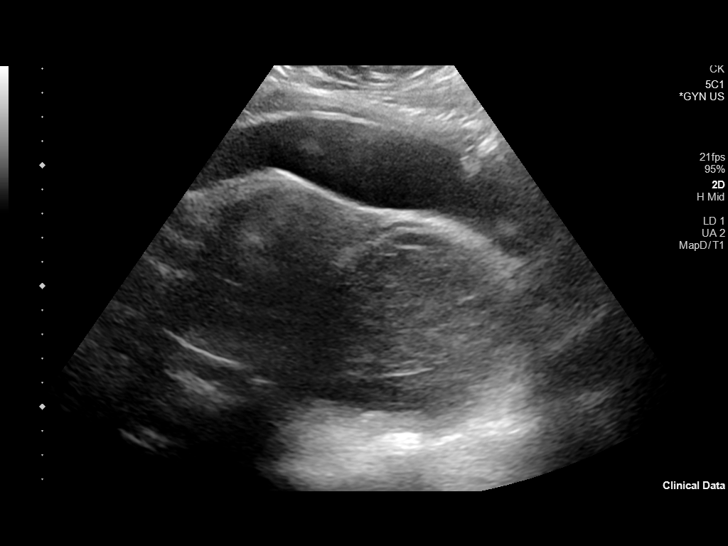
[im 10/110]
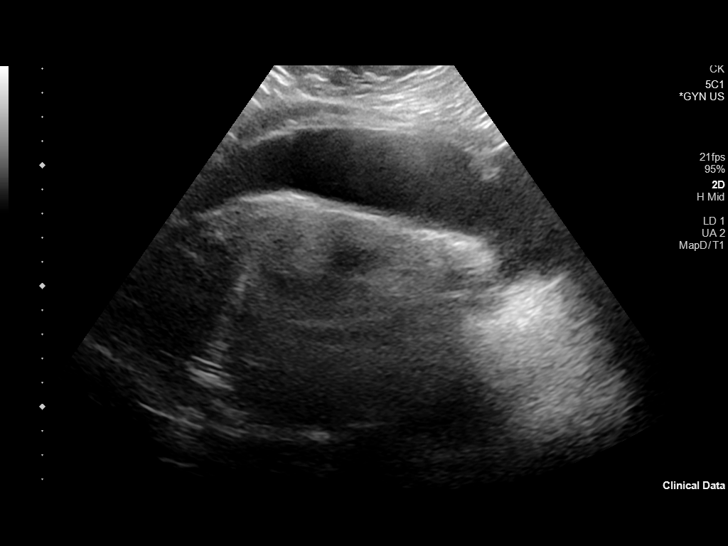
[im 19/110]
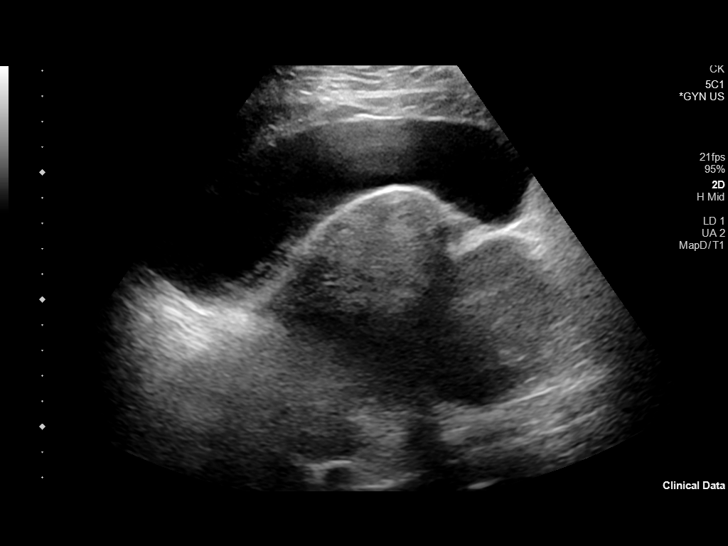
[im 28/110]
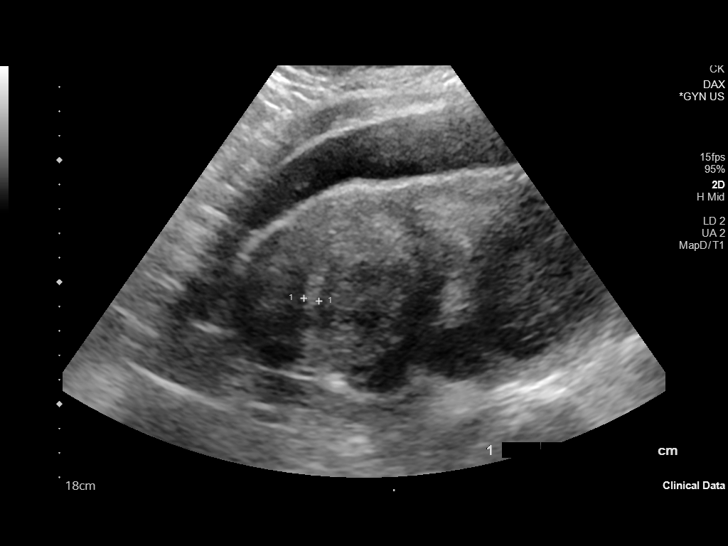
[im 37/110]
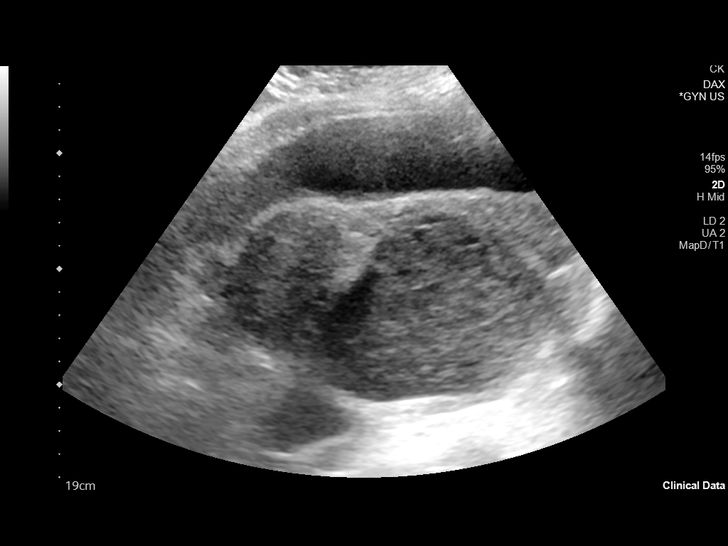
[im 46/110]
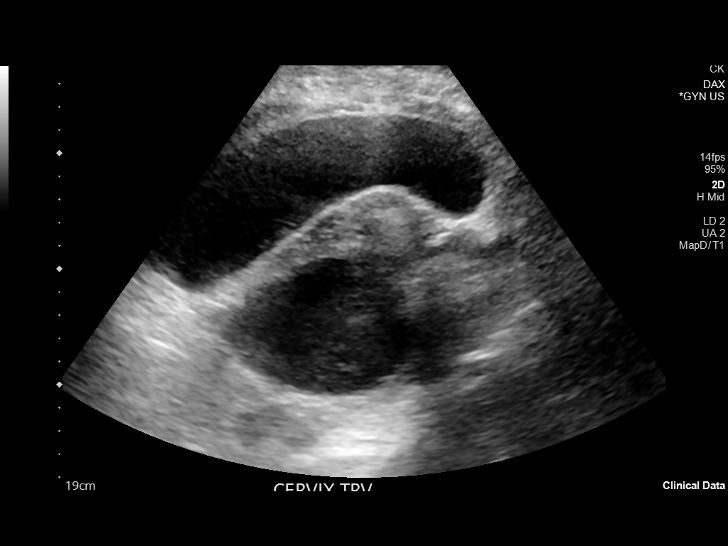
[im 55/110]
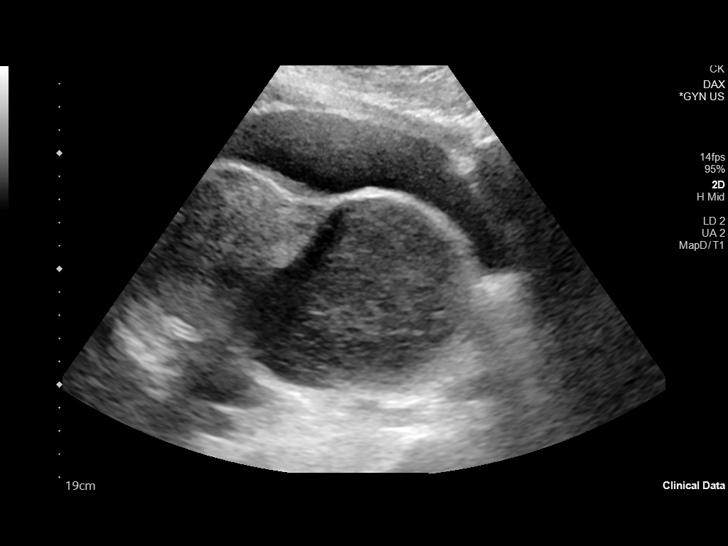
[im 64/110]
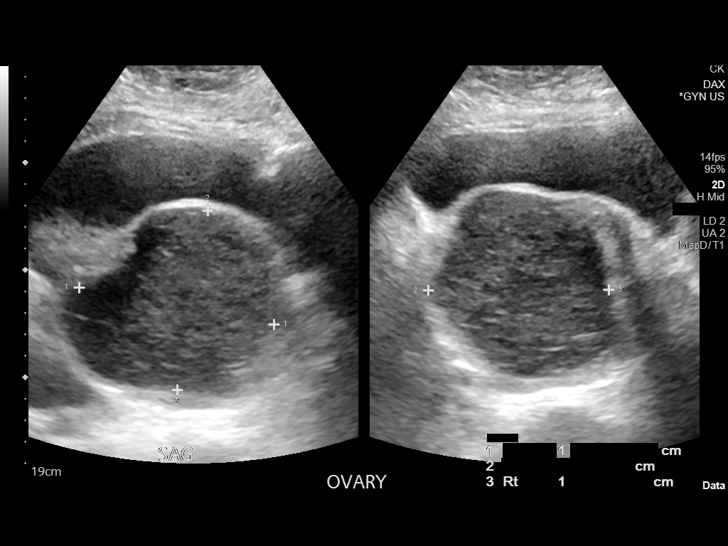
[im 73/110]
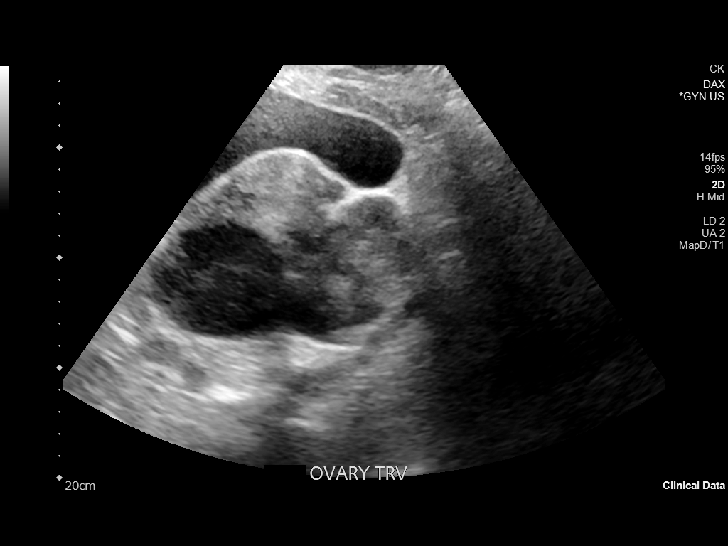
[im 82/110]
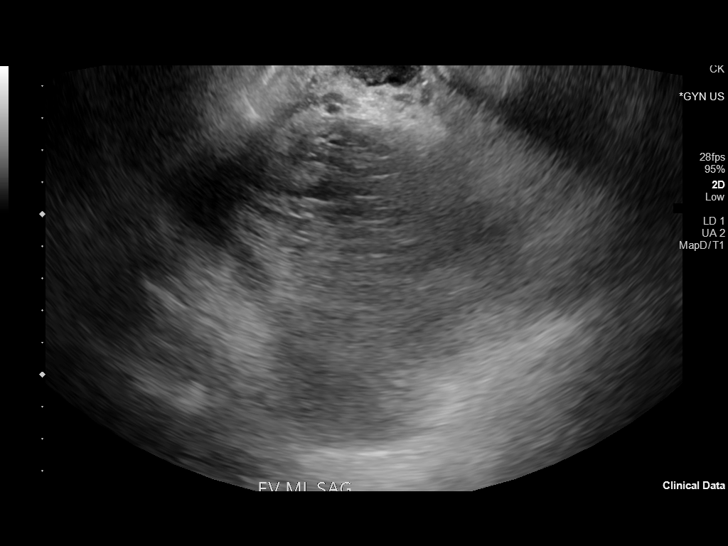
[im 91/110]
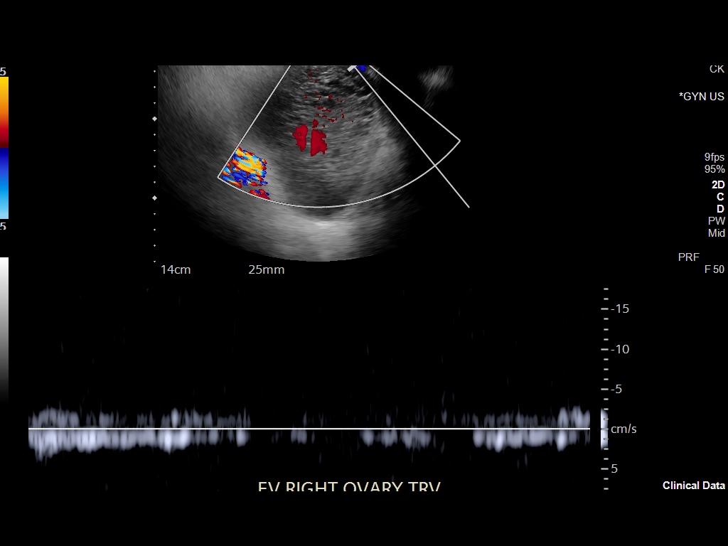
[im 100/110]
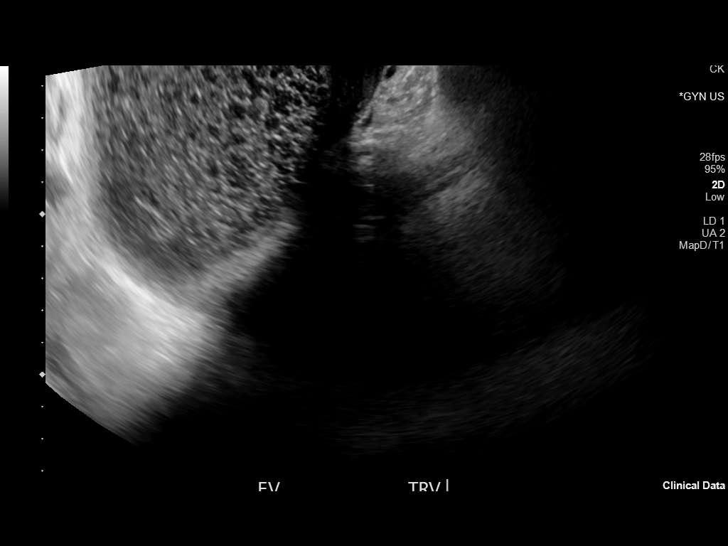
[im 110/110]
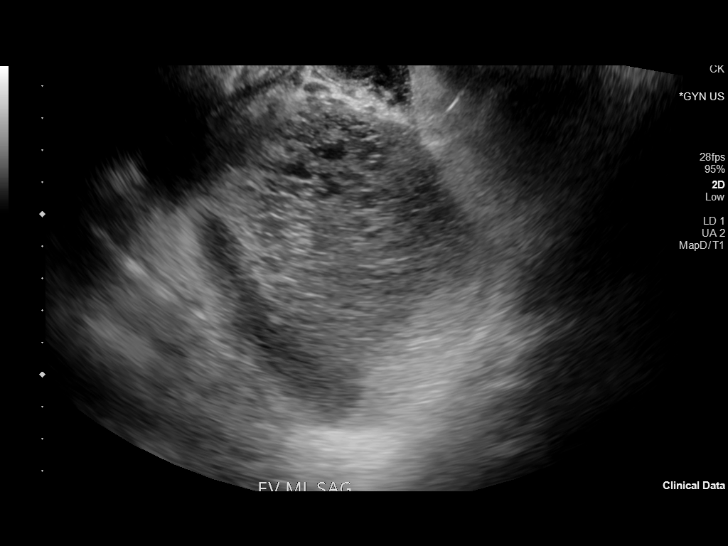

[13 of 25 positions shown; findings below may reference images not displayed]

FINDINGS: Uterus

Measurements: 15.2 x 7.9 x 7.9 cm = volume: 494 mL. Multiple small
fibroids, the largest 3.2 cm in the anterior fundus. Diffusely
heterogeneous echotexture.

Endometrium

Thickness: Normal thickness, 6 mm.  No focal abnormality visualized.

Right ovary

Measurements: 10.5 x 7.4 x 8.8 cm = volume: 355 mL. Complex right
ovarian cyst with diffuse internal echoes measures up to 9.2 cm.

Left ovary

Measurements: 7.8 x 6.9 x 5.6 cm. Volume 159 mL. No visible focal
abnormality.

Pulsed Doppler evaluation of both ovaries demonstrates normal
low-resistance arterial and venous waveforms in the right ovary.
Left ovary is difficult to visualize and evaluate due to body
habitus. Venous blood flow is documented, but unable to definitively
document arterial blood flow, possibly related to body habitus and
technical reasons.

Other findings

No abnormal free fluid.
IMPRESSION: Enlarged fibroid uterus.

Large complex cystic mass in the right ovary measuring up to 9.2 cm
with diffuse low level internal echoes. This could reflect
hemorrhagic cyst or endometrioma. Recommend follow-up ultrasound in
3-6 months to assess stability.

Left ovary difficult to interrogate rate with Doppler due to depth
and body habitus. Arterial blood flow cannot be definitively
visualized but venous blood flow is seen. Normal blood flow on the
right.

## 2022-03-15 ENCOUNTER — Other Ambulatory Visit: Payer: Self-pay

## 2022-03-15 ENCOUNTER — Ambulatory Visit
Admission: EM | Admit: 2022-03-15 | Discharge: 2022-03-15 | Disposition: A | Discharge status: Home / Self Care | Payer: BC Managed Care – PPO | Attending: Internal Medicine | Admitting: Internal Medicine

## 2022-03-15 ENCOUNTER — Encounter: Payer: Self-pay | Admitting: Emergency Medicine

## 2022-03-15 DIAGNOSIS — R030 Elevated blood-pressure reading, without diagnosis of hypertension: Secondary | ICD-10-CM | POA: Diagnosis not present

## 2022-03-15 DIAGNOSIS — T7840XA Allergy, unspecified, initial encounter: Secondary | ICD-10-CM | POA: Diagnosis not present

## 2022-03-15 MED ORDER — CETIRIZINE HCL 10 MG PO TABS: 10.0000 mg | ORAL_TABLET | Freq: Every day | ORAL | 0 refills | Status: AC

## 2022-03-15 MED ORDER — TRIAMCINOLONE ACETONIDE 0.1 % EX CREA: 1.0000 | TOPICAL_CREAM | Freq: Two times a day (BID) | CUTANEOUS | 0 refills | Status: AC

## 2022-03-15 MED ORDER — AMLODIPINE BESYLATE 10 MG PO TABS: 10.0000 mg | ORAL_TABLET | Freq: Every day | ORAL | 0 refills | Status: AC

## 2022-03-15 NOTE — ED Provider Notes (Signed)
[facility]    CSN: [id number] Arrival date & time: 03/15/22  0803      History   Chief Complaint Chief Complaint  Patient presents with   Rash    HPI Kristi Bowen is a 36 y.o. female.   Patient presents with intermittent, itchy rash that has been present for about 6 days.  Patient states that she works at [employer], and the rash occurs when she walks into the cooler fridge.  She states that it will go away when she walks out.  The rash resembles hives and is mainly present on her arms.  Denies any other changes in environment including lotions, soaps, detergents, foods, etc.  She denies any fever associated with the rash.  She has taken Benadryl with minimal improvement. Denies that she currently has a rash. Denies any feelings of throat closing or shortness of breath currently or when the rash occurs.   Patient also has significantly elevated blood pressure reading.  Patient reports that she used to take blood pressure medication approximately 2 years ago but stopped taking it per her PCP given that her blood pressure was under control.  She checks her blood pressure intermittently at home but very rarely.  States that it is normal when she checks it.  The ambulance was called to her workplace about 6 days ago when the rash first started and her blood pressure was 811 systolic at that time taken via EMS.  She denies that she has been having any chest pain, shortness of breath, headache, dizziness, blurred vision, nausea, vomiting.  States that she saw her PCP last week and had blood work completed given that she has to take iron supplementation.   Rash   History reviewed. No pertinent past medical history.  Patient Active Problem List   Diagnosis Date Noted   Morbid obesity ([place]) 11/27/2018   Infertility, female 11/27/2018   Hemorrhagic ovarian cyst 11/27/2018   Dysmenorrhea 11/27/2018    History reviewed. No pertinent surgical history.  OB History   No  obstetric history on file.      Home Medications    Prior to Admission medications   Medication Sig Start Date End Date Taking? Authorizing Provider  amLODipine (NORVASC) 10 MG tablet Take 1 tablet (10 mg total) by mouth daily. 03/15/22  Yes Brettany Sydney, Michele Rockers, FNP  cetirizine (ZYRTEC) 10 MG tablet Take 1 tablet (10 mg total) by mouth daily. 03/15/22  Yes [place] Subramaniam, Hildred Alamin E, FNP  triamcinolone cream (KENALOG) 0.1 % Apply 1 Application topically 2 (two) times daily. 03/15/22  Yes Tania Steinhauser, Hildred Alamin E, FNP  acetaminophen (TYLENOL) 500 MG tablet Take 1,000 mg by mouth every 6 (six) hours as needed for moderate pain.    [provider]  clindamycin (CLEOCIN) 150 MG capsule Take 3 capsules (450 mg total) by mouth 3 (three) times daily. 10/18/20   Muthersbaugh, Jarrett Soho, PA-C  HYDROcodone-acetaminophen (NORCO/VICODIN) 5-325 MG tablet Take 1 tablet by mouth every 4 (four) hours as needed for severe pain. 08/06/19   Charlann Lange, PA-C  ibuprofen (ADVIL) 600 MG tablet Take 1 tablet (600 mg total) by mouth every 6 (six) hours as needed. 08/06/19   Charlann Lange, PA-C  ondansetron (ZOFRAN ODT) 4 MG disintegrating tablet Take 1 tablet (4 mg total) by mouth every 8 (eight) hours as needed. Patient not taking: Reported on 11/27/2018 10/13/18   McDonald, Maree Erie A, PA-C  traMADol (ULTRAM) 50 MG tablet Take 1 tablet (50 mg total) by mouth every 6 (six) hours  as needed. Patient not taking: Reported on 11/27/2018 10/13/18   McDonald, Mia A, PA-C  albuterol (PROVENTIL HFA;VENTOLIN HFA) 108 (90 Base) MCG/ACT inhaler Inhale 2 puffs into the lungs every 4 (four) hours as needed for wheezing or shortness of breath. Patient not taking: Reported on 12/22/2017 08/03/15 10/13/18  Horton, Barbette Hair, MD    Family History History reviewed. No pertinent family history.  Social History Social History   Tobacco Use   Smoking status: Every Day   Smokeless tobacco: Never  Substance Use Topics   Alcohol use: No   Drug use: No      Allergies   Patient has no known allergies.   Review of Systems Review of Systems Per HPI  Physical Exam Triage Vital Signs ED Triage Vitals [03/15/22 0825]  Enc Vitals Group     BP (!) 208/143     Pulse Rate 96     Resp 18     Temp 99.3 F (37.4 C)     Temp Source Oral     SpO2 97 %     Weight      Height      Head Circumference      Peak Flow      Pain Score 0     Pain Loc      Pain Edu?      Excl. in [place]?    No data found.  Updated Vital Signs BP (!) 195/144 (BP Location: Left Arm)   Pulse 96   Temp 99.3 F (37.4 C) (Oral)   Resp 18   SpO2 97%   Visual Acuity Right Eye Distance:   Left Eye Distance:   Bilateral Distance:    Right Eye Near:   Left Eye Near:    Bilateral Near:     Physical Exam Constitutional:      General: She is not in acute distress.    Appearance: Normal appearance. She is not toxic-appearing or diaphoretic.  HENT:     Head: Normocephalic and atraumatic.  Eyes:     Extraocular Movements: Extraocular movements intact.     Conjunctiva/sclera: Conjunctivae normal.     Pupils: Pupils are equal, round, and reactive to light.  Cardiovascular:     Rate and Rhythm: Normal rate and regular rhythm.     Pulses: Normal pulses.     Heart sounds: Normal heart sounds.  Pulmonary:     Effort: Pulmonary effort is normal. No respiratory distress.     Breath sounds: Normal breath sounds.  Skin:    Comments: No rash noted.   Neurological:     General: No focal deficit present.     Mental Status: She is alert and oriented to person, place, and time. Mental status is at baseline.     Cranial Nerves: Cranial nerves 2-12 are intact.     Sensory: Sensation is intact.     Motor: Motor function is intact.     Coordination: Coordination is intact.     Gait: Gait is intact.  Psychiatric:        Mood and Affect: Mood normal.        Behavior: Behavior normal.        Thought Content: Thought content normal.        Judgment: Judgment normal.       UC Treatments / Results  Labs (all labs ordered are listed, but only abnormal results are displayed) Labs Reviewed - No data to display  EKG   Radiology No results found.  Procedures Procedures (including critical care time)  Medications Ordered in UC Medications - No data to display  Initial Impression / Assessment and Plan / UC Course  I have reviewed the triage vital signs and the nursing notes.  Pertinent labs & imaging results that were available during my care of the patient were reviewed by me and considered in my medical decision making (see chart for details).     Patient has a picture of rash on her phone.  It resembles hives.  She states that the rash typically only occurs when she walks into the cooler fridge at work.  Therefore, she is most likely allergic to something in that environment.  Advised patient to avoid the cooler fridge if at all as possible.  Will start patient on daily antihistamine for preventative.  Also prescribed triamcinolone in case rash occurs again to help with itchiness and to decrease inflammation.  Advised patient that it may be best to follow-up with asthma and allergy specialist at provided contact information to have further testing to determine what she may be allergic to.   She also has significantly elevated blood pressure reading.  Patient used to take valsartan HCTZ as well as amlodipine.  She stopped this 2 years ago given PCP discontinued it.  She is currently asymptomatic, neuro exam and physical exam are normal, and no signs of endorgan damage on exam so do not think that emergent evaluation is necessary. Recent CMP was unremarkable on 03/10/22. Therefore, I do think it is reasonable to start patient on blood pressure medication today and have her monitor it very closely.  Will restart amlodipine at 10 mg.  Patient has blood pressure cuff at home and was advised to monitor her blood pressure very diligently today and to follow-up  at the ER if it remains elevated.  She was also advised to follow-up with PCP as soon as possible for further management of blood pressure.  She was advised that if she is unable to follow-up with PCP, then she should follow-up with urgent care for recheck.  Strict return and ER precautions for all chief complaints today.  Patient verbalized understanding and was agreeable with plan. Final Clinical Impressions(s) / UC Diagnoses   Final diagnoses:  Allergic reaction, initial encounter  Elevated blood pressure reading     Discharge Instructions      I have prescribed you an allergy medicine that you will take daily as preventative.  Please avoid the cooler at your workplace if at all possible given that this appears to be the culprit.  I have also prescribed a cream that you can apply for any itchiness or when the rash occurs.  Follow-up with allergy and asthma specialist to schedule an appointment for further evaluation to see what you could be allergic to.   If you are not able to get in with your primary care doctor, you may follow-up with urgent care for recheck of blood pressure in 1 week.  You also have very elevated blood pressure.  I am going to restart you on your blood pressure medication today.  Please pick this up and start taking it immediately.  Please monitor your blood pressure very closely for the next 48 hours.  If it remains elevated today despite the medication, please go straight to the emergency department.  Go straight to the ER if you develop chest pain, shortness of breath, headache, dizziness, blurred vision, nausea, vomiting as well.  Follow-up with your primary care doctor for further refills  and blood pressure management as well.    ED Prescriptions     Medication Sig Dispense Auth. Provider   cetirizine (ZYRTEC) 10 MG tablet Take 1 tablet (10 mg total) by mouth daily. 30 tablet [place], [place] E, [place]   triamcinolone cream (KENALOG) 0.1 % Apply 1 Application topically  2 (two) times daily. 30 g Oswaldo Conroy E, [place]   amLODipine (NORVASC) 10 MG tablet Take 1 tablet (10 mg total) by mouth daily. 60 tablet [place], Michele Rockers, [place]      PDMP not reviewed this encounter.   Teodora Medici, [place] 03/15/22 [phone]

## 2022-03-15 NOTE — Discharge Instructions (Addendum)
I have prescribed you an allergy medicine that you will take daily as preventative.  Please avoid the cooler at your workplace if at all possible given that this appears to be the culprit.  I have also prescribed a cream that you can apply for any itchiness or when the rash occurs.  Follow-up with allergy and asthma specialist to schedule an appointment for further evaluation to see what you could be allergic to.   If you are not able to get in with your primary care doctor, you may follow-up with urgent care for recheck of blood pressure in 1 week.  You also have very elevated blood pressure.  I am going to restart you on your blood pressure medication today.  Please pick this up and start taking it immediately.  Please monitor your blood pressure very closely for the next 48 hours.  If it remains elevated today despite the medication, please go straight to the emergency department.  Go straight to the ER if you develop chest pain, shortness of breath, headache, dizziness, blurred vision, nausea, vomiting as well.  Follow-up with your primary care doctor for further refills and blood pressure management as well.

## 2022-03-15 NOTE — ED Triage Notes (Signed)
Pt here for generalized itchy rash with episode last week as well; happens when at work; noted htn pt sts hx of same but not on meds

## 2022-04-06 NOTE — Progress Notes (Unsigned)
New Patient Note  RE: Kristi Bowen MRN: [id number] DOB: 06-16-1985 Date of Office Visit: 04/07/2022  Consult requested by: Bartholome Bill, MD Primary care provider: Bartholome Bill, MD  Chief Complaint: No chief complaint on file.  History of Present Illness: I had the pleasure of seeing Kristi Bowen for initial evaluation at the [facility] on 04/06/2022. She is a 37 y.o. female, who is referred here by Bartholome Bill, MD for the evaluation of rash.  Rash started about *** ago. Mainly occurs on her ***. Describes them as ***. Individual rashes lasts about ***. No ecchymosis upon resolution. Associated symptoms include: ***.  Frequency of episodes: ***. Suspected triggers are ***. Denies any *** fevers, chills, changes in medications, foods, personal care products or recent infections. She has tried the following therapies: *** with *** benefit. Systemic steroids ***. Currently on ***.  Previous work up includes: ***. Previous history of rash/hives: ***. Patient is up to date with the following cancer screening tests: ***.  03/15/2022 UC visit: "Patient presents with intermittent, itchy rash that has been present for about 6 days.  Patient states that she works at [employer], and the rash occurs when she walks into the cooler fridge.  She states that it will go away when she walks out.  The rash resembles hives and is mainly present on her arms.  Denies any other changes in environment including lotions, soaps, detergents, foods, etc.  She denies any fever associated with the rash.  She has taken Benadryl with minimal improvement. Denies that she currently has a rash. Denies any feelings of throat closing or shortness of breath currently or when the rash occurs.    Patient also has significantly elevated blood pressure reading.  Patient reports that she used to take blood pressure medication approximately 2 years ago but stopped taking it per her PCP  given that her blood pressure was under control.  She checks her blood pressure intermittently at home but very rarely.  States that it is normal when she checks it.  The ambulance was called to her workplace about 6 days ago when the rash first started and her blood pressure was 850 systolic at that time taken via EMS.  She denies that she has been having any chest pain, shortness of breath, headache, dizziness, blurred vision, nausea, vomiting.  States that she saw her PCP last week and had blood work completed given that she has to take iron supplementation.  Patient has a picture of rash on her phone.  It resembles hives.  She states that the rash typically only occurs when she walks into the cooler fridge at work.  Therefore, she is most likely allergic to something in that environment.  Advised patient to avoid the cooler fridge if at all as possible.  Will start patient on daily antihistamine for preventative.  Also prescribed triamcinolone in case rash occurs again to help with itchiness and to decrease inflammation.  Advised patient that it may be best to follow-up with asthma and allergy specialist at provided contact information to have further testing to determine what she may be allergic to. "  Assessment and Plan: Kristi Bowen is a 36 y.o. female with: No problem-specific Assessment & Plan notes found for this encounter.  No follow-ups on file.  No orders of the defined types were placed in this encounter.  Lab Orders  No laboratory test(s) ordered today    Other allergy screening: Asthma: {Blank single:19197::"yes","no"} Rhino  conjunctivitis: {Blank single:19197::"yes","no"} Food allergy: {Blank single:19197::"yes","no"} Medication allergy: {Blank single:19197::"yes","no"} Hymenoptera allergy: {Blank single:19197::"yes","no"} Urticaria: {Blank single:19197::"yes","no"} Eczema:{Blank single:19197::"yes","no"} History of recurrent infections suggestive of immunodeficency: {Blank  single:19197::"yes","no"}  Diagnostics: Spirometry:  Tracings reviewed. Her effort: {Blank single:19197::"Good reproducible efforts.","It was hard to get consistent efforts and there is a question as to whether this reflects a maximal maneuver.","Poor effort, data can not be interpreted."} FVC: ***L FEV1: ***L, ***% predicted FEV1/FVC ratio: ***% Interpretation: {Blank single:19197::"Spirometry consistent with mild obstructive disease","Spirometry consistent with moderate obstructive disease","Spirometry consistent with severe obstructive disease","Spirometry consistent with possible restrictive disease","Spirometry consistent with mixed obstructive and restrictive disease","Spirometry uninterpretable due to technique","Spirometry consistent with normal pattern","No overt abnormalities noted given today's efforts"}.  Please see scanned spirometry results for details.  Skin Testing: {Blank single:19197::"Select foods","Environmental allergy panel","Environmental allergy panel and select foods","Food allergy panel","None","Deferred due to recent antihistamines use"}. *** Results discussed with patient/family.   Past Medical History: Patient Active Problem List   Diagnosis Date Noted   Morbid obesity ([place]) 11/27/2018   Infertility, female 11/27/2018   Hemorrhagic ovarian cyst 11/27/2018   Dysmenorrhea 11/27/2018   No past medical history on file. Past Surgical History: No past surgical history on file. Medication List:  Current Outpatient Medications  Medication Sig Dispense Refill   acetaminophen (TYLENOL) 500 MG tablet Take 1,000 mg by mouth every 6 (six) hours as needed for moderate pain.     amLODipine (NORVASC) 10 MG tablet Take 1 tablet (10 mg total) by mouth daily. 60 tablet 0   cetirizine (ZYRTEC) 10 MG tablet Take 1 tablet (10 mg total) by mouth daily. 30 tablet 0   clindamycin (CLEOCIN) 150 MG capsule Take 3 capsules (450 mg total) by mouth 3 (three) times daily. 90 capsule 0    HYDROcodone-acetaminophen (NORCO/VICODIN) 5-325 MG tablet Take 1 tablet by mouth every 4 (four) hours as needed for severe pain. 8 tablet 0   ibuprofen (ADVIL) 600 MG tablet Take 1 tablet (600 mg total) by mouth every 6 (six) hours as needed. 30 tablet 0   ondansetron (ZOFRAN ODT) 4 MG disintegrating tablet Take 1 tablet (4 mg total) by mouth every 8 (eight) hours as needed. (Patient not taking: Reported on 11/27/2018) 20 tablet 0   traMADol (ULTRAM) 50 MG tablet Take 1 tablet (50 mg total) by mouth every 6 (six) hours as needed. (Patient not taking: Reported on 11/27/2018) 5 tablet 0   triamcinolone cream (KENALOG) 0.1 % Apply 1 Application topically 2 (two) times daily. 30 g 0   No current facility-administered medications for this visit.   Allergies: No Known Allergies Social History: Social History   Socioeconomic History   Marital status: Married    Spouse name: Not on file   Number of children: Not on file   Years of education: Not on file   Highest education level: Not on file  Occupational History   Not on file  Tobacco Use   Smoking status: Every Day   Smokeless tobacco: Never  Substance and Sexual Activity   Alcohol use: No   Drug use: No   Sexual activity: Not on file  Other Topics Concern   Not on file  Social History Narrative   Not on file   Social Determinants of Health   Financial Resource Strain: Not on file  Food Insecurity: Not on file  Transportation Needs: Not on file  Physical Activity: Not on file  Stress: Not on file  Social Connections: Not on file   Lives in a ***. Smoking: ***  Occupation: ***  Environmental HistoryFreight forwarder in the house: Estate agent in the family room: {Blank single:19197::"yes","no"} Carpet in the bedroom: {Blank single:19197::"yes","no"} Heating: {Blank single:19197::"electric","gas","heat pump"} Cooling: {Blank single:19197::"central","window","heat pump"} Pet: {Blank  single:19197::"yes ***","no"}  Family History: No family history on file. Problem                               Relation Asthma                                   *** Eczema                                *** Food allergy                          *** Allergic rhino conjunctivitis     ***  Review of Systems  Constitutional:  Negative for appetite change, chills, fever and unexpected weight change.  HENT:  Negative for congestion and rhinorrhea.   Eyes:  Negative for itching.  Respiratory:  Negative for cough, chest tightness, shortness of breath and wheezing.   Cardiovascular:  Negative for chest pain.  Gastrointestinal:  Negative for abdominal pain.  Genitourinary:  Negative for difficulty urinating.  Skin:  Negative for rash.  Neurological:  Negative for headaches.    Objective: There were no vitals taken for this visit. There is no height or weight on file to calculate BMI. Physical Exam Vitals and nursing note reviewed.  Constitutional:      Appearance: Normal appearance. She is well-developed.  HENT:     Head: Normocephalic and atraumatic.     Right Ear: Tympanic membrane and external ear normal.     Left Ear: Tympanic membrane and external ear normal.     Nose: Nose normal.     Mouth/Throat:     Mouth: Mucous membranes are moist.     Pharynx: Oropharynx is clear.  Eyes:     Conjunctiva/sclera: Conjunctivae normal.  Cardiovascular:     Rate and Rhythm: Normal rate and regular rhythm.     Heart sounds: Normal heart sounds. No murmur heard.    No friction rub. No gallop.  Pulmonary:     Effort: Pulmonary effort is normal.     Breath sounds: Normal breath sounds. No wheezing, rhonchi or rales.  Musculoskeletal:     Cervical back: Neck supple.  Skin:    General: Skin is warm.     Findings: No rash.  Neurological:     Mental Status: She is alert and oriented to person, place, and time.  Psychiatric:        Behavior: Behavior normal.    The plan was reviewed with  the patient/family, and all questions/concerned were addressed.  It was my pleasure to see Aashritha today and participate in her care. Please feel free to contact me with any questions or concerns.  Sincerely,  Rexene Alberts, DO Allergy & Immunology  [facility] office: [place] office: [phone]

## 2022-04-07 ENCOUNTER — Encounter: Payer: Self-pay | Admitting: Allergy

## 2022-04-07 ENCOUNTER — Other Ambulatory Visit: Payer: Self-pay

## 2022-04-07 ENCOUNTER — Ambulatory Visit: Payer: BC Managed Care – PPO | Admitting: Allergy

## 2022-04-07 VITALS — BP 180/90 | HR 110 | Temp 98.9°F | Resp 20 | Ht 63.0 in | Wt 393.3 lb

## 2022-04-07 DIAGNOSIS — L509 Urticaria, unspecified: Secondary | ICD-10-CM | POA: Diagnosis not present

## 2022-04-07 DIAGNOSIS — L299 Pruritus, unspecified: Secondary | ICD-10-CM | POA: Diagnosis not present

## 2022-04-07 DIAGNOSIS — T7840XD Allergy, unspecified, subsequent encounter: Secondary | ICD-10-CM

## 2022-04-07 DIAGNOSIS — R030 Elevated blood-pressure reading, without diagnosis of hypertension: Secondary | ICD-10-CM | POA: Diagnosis not present

## 2022-04-07 DIAGNOSIS — T7840XA Allergy, unspecified, initial encounter: Secondary | ICD-10-CM | POA: Diagnosis not present

## 2022-04-07 MED ORDER — EPINEPHRINE 0.3 MG/0.3ML IJ SOAJ: 0.3000 mg | INTRAMUSCULAR | 1 refills | Status: AC | PRN

## 2022-04-07 MED ORDER — FAMOTIDINE 20 MG PO TABS: 20.0000 mg | ORAL_TABLET | Freq: Two times a day (BID) | ORAL | 2 refills | Status: AC

## 2022-04-07 NOTE — Assessment & Plan Note (Addendum)
Follow up with PCP regarding your blood pressure - 180/90 in the office today. Denies symptoms - no headaches, dizziness.

## 2022-04-07 NOTE — Patient Instructions (Addendum)
Today's skin testing showed: Negative to indoor/outdoor allergens and food panel.   Results given.  Itching/hives/allergic reaction: Not sure what triggered this. It may not be allergic in nature.  I don't have testing for cleaning supplies.  See below for proper skin care.   Start zyrtec (cetirizine) '10mg'$  once a day and may take twice a day if needed.  OR you can also take allegra '180mg'$  1-2 times a day if needed.  If symptoms are not controlled or causes drowsiness let us know. Avoid the following potential triggers: alcohol, tight clothing, NSAIDs, hot showers and getting overheated.  During flares:  Start Pepcid (famotidine) '20mg'$  twice a day.   I have prescribed epinephrine injectable device and demonstrated proper use. For mild symptoms you can take over the counter antihistamines such as Benadryl 1-2 tablets = 25-'50mg'$ .and monitor symptoms closely. If symptoms worsen or if you have severe symptoms including breathing issues, throat closure, significant swelling, whole body hives, severe diarrhea and vomiting, lightheadedness then inject epinephrine and seek immediate medical care afterwards. Emergency action plan given.  Get bloodwork We are ordering labs, so please allow 1-2 weeks for the results to come back. With the newly implemented Cures Act, the labs might be visible to you at the same time that they become visible to me. However, I will not address the results until all of the results are back, so please be patient.  In the meantime, continue recommendations in your patient instructions, including avoidance measures (if applicable), until you hear from me.  Follow up in 1 months or sooner if needed.    Follow up with PCP regarding your blood pressure - 180/90 in the office today. Letter written for work.  Skin care recommendations  Bath time: Always use lukewarm water. AVOID very hot or cold water. Keep bathing time to 5-10 minutes. Do NOT use bubble bath. Use a mild  soap and use just enough to wash the dirty areas. Do NOT scrub skin vigorously.  After bathing, pat dry your skin with a towel. Do NOT rub or scrub the skin.  Moisturizers and prescriptions:  ALWAYS apply moisturizers immediately after bathing (within 3 minutes). This helps to lock-in moisture. Use the moisturizer several times a day over the whole body. Good summer moisturizers include: Aveeno, CeraVe, Cetaphil. Good winter moisturizers include: Aquaphor, Vaseline, Cerave, Cetaphil, Eucerin, Vanicream. When using moisturizers along with medications, the moisturizer should be applied about one hour after applying the medication to prevent diluting effect of the medication or moisturize around where you applied the medications. When not using medications, the moisturizer can be continued twice daily as maintenance.  Laundry and clothing: Avoid laundry products with added color or perfumes. Use unscented hypo-allergenic laundry products such as Tide free, Cheer free & gentle, and All free and clear.  If the skin still seems dry or sensitive, you can try double-rinsing the clothes. Avoid tight or scratchy clothing such as wool. Do not use fabric softeners or dyer sheets.

## 2022-04-07 NOTE — Assessment & Plan Note (Signed)
2 episodes of pruritic rash/hives with 1 episode of facial swelling while at work. No triggers noted. Denies changes in diet, meds, personal are products or recent infections. Today's skin prick testing showed: Negative to indoor/outdoor allergens and food panel.  Discussed with patient that based on history and testing results - etiology is unclear. I don't have testing for cleaning supplies.  See below for proper skin care.  Start zyrtec (cetirizine) '10mg'$  once a day and may take twice a day if needed.  OR you can also take allegra '180mg'$  1-2 times a day if needed.  If symptoms are not controlled or causes drowsiness let us know. Avoid the following potential triggers: alcohol, tight clothing, NSAIDs, hot showers and getting overheated. During flares: Start Pepcid (famotidine) '20mg'$  twice a day.  I have prescribed epinephrine injectable device and demonstrated proper use. For mild symptoms you can take over the counter antihistamines such as Benadryl 1-2 tablets = 25-'50mg'$ .and monitor symptoms closely. If symptoms worsen or if you have severe symptoms including breathing issues, throat closure, significant swelling, whole body hives, severe diarrhea and vomiting, lightheadedness then inject epinephrine and seek immediate medical care afterwards. Emergency action plan given. Get bloodwork to rule out other etiologies. Letter written for work - excuse until [employer] has resulted.

## 2022-04-14 LAB — CBC WITH DIFFERENTIAL/PLATELET
Basophils Absolute: 0.1 10*3/uL (ref 0.0–0.2)
Basos: 0 %
EOS (ABSOLUTE): 0.1 10*3/uL (ref 0.0–0.4)
Eos: 0 %
Hematocrit: 40.2 % (ref 34.0–46.6)
Hemoglobin: 12.2 g/dL (ref 11.1–15.9)
Immature Grans (Abs): 0 10*3/uL (ref 0.0–0.1)
Immature Granulocytes: 0 %
Lymphocytes Absolute: 1.6 10*3/uL (ref 0.7–3.1)
Lymphs: 14 %
MCH: 25.7 pg — ABNORMAL LOW (ref 26.6–33.0)
MCHC: 30.3 g/dL — ABNORMAL LOW (ref 31.5–35.7)
MCV: 85 fL (ref 79–97)
Monocytes Absolute: 0.9 10*3/uL (ref 0.1–0.9)
Monocytes: 8 %
Neutrophils Absolute: 9 10*3/uL — ABNORMAL HIGH (ref 1.4–7.0)
Neutrophils: 78 %
Platelets: 454 10*3/uL — ABNORMAL HIGH (ref 150–450)
RBC: 4.75 x10E6/uL (ref 3.77–5.28)
RDW: 14.8 % (ref 11.7–15.4)
WBC: 11.7 10*3/uL — ABNORMAL HIGH (ref 3.4–10.8)

## 2022-04-14 LAB — COMPREHENSIVE METABOLIC PANEL WITH GFR
ALT: 11 IU/L (ref 0–32)
AST: 16 IU/L (ref 0–40)
Albumin/Globulin Ratio: 1.2 (ref 1.2–2.2)
Albumin: 4.2 g/dL (ref 3.9–4.9)
Alkaline Phosphatase: 53 IU/L (ref 44–121)
BUN/Creatinine Ratio: 20 (ref 9–23)
BUN: 17 mg/dL (ref 6–20)
Bilirubin Total: 0.3 mg/dL (ref 0.0–1.2)
CO2: 22 mmol/L (ref 20–29)
Calcium: 9.8 mg/dL (ref 8.7–10.2)
Chloride: 102 mmol/L (ref 96–106)
Creatinine, Ser: 0.87 mg/dL (ref 0.57–1.00)
Globulin, Total: 3.5 g/dL (ref 1.5–4.5)
Glucose: 95 mg/dL (ref 70–99)
Potassium: 4.3 mmol/L (ref 3.5–5.2)
Sodium: 139 mmol/L (ref 134–144)
Total Protein: 7.7 g/dL (ref 6.0–8.5)
eGFR: 88 mL/min/1.73

## 2022-04-14 LAB — ALPHA-GAL PANEL
Allergen Lamb IgE: <0.1 kU/L
Beef IgE: <0.1 kU/L
IgE (Immunoglobulin E), Serum: 41 IU/mL (ref 6–495)
O215-IgE Alpha-Gal: <0.1 kU/L
Pork IgE: <0.1 kU/L

## 2022-04-14 LAB — THYROID CASCADE PROFILE: TSH: 1.15 u[IU]/mL (ref 0.450–4.500)

## 2022-04-14 LAB — CHRONIC URTICARIA: cu index: 2.7 (ref <10)

## 2022-04-14 LAB — SEDIMENTATION RATE: Sed Rate: 92 mm/hr — ABNORMAL HIGH (ref 0–32)

## 2022-04-14 LAB — C-REACTIVE PROTEIN: CRP: 1 mg/L (ref 0–10)

## 2022-04-14 LAB — ANA W/REFLEX: Anti Nuclear Antibody (ANA): NEGATIVE

## 2022-04-14 LAB — C3 AND C4
Complement C3, Serum: 129 mg/dL (ref 82–167)
Complement C4, Serum: 22 mg/dL (ref 12–38)

## 2022-04-14 LAB — ALLERGEN FIRE ANT: I070-IgE Fire Ant (Invicta): <0.1 kU/L

## 2022-04-14 LAB — TRYPTASE: Tryptase: 7.5 ug/L (ref 2.2–13.2)

## 2022-04-14 NOTE — Progress Notes (Signed)
Please call patient.  Reviewed bloodwork.  White count and platelets were borderline elevated - this seems similar as in the past. Sed rate which is an inflammation marker was high - will need to recheck this at next visit.   Kidney function, liver function, electrolytes, thyroid, autoimmune screener, chronic urticaria index (checks for autoantibodies that trigger mast cells), tryptase (checks for mast cell issues), fire ant and alpha gal (checks for red meat allergy) were all normal which is great.   Based on these results, I'm not sure what triggered your episode.   Please continue to take zyrtec or allegra twice a day for now.  Any additional episodes? Can she go to her workplace to just "visit" and see if she has another episode?   Keep follow up in January - recommend to not work until that visit for now unless she really needs the income from her work.

## 2022-05-04 NOTE — Progress Notes (Signed)
Follow Up Note  RE: [name] MRN: [id number] DOB: [birth year] Date of Office Visit: 05/05/2022  Referring provider: Bartholome Bill, MD Primary care provider: Bartholome Bill, MD  Chief Complaint: Follow-up (No issues and no refills needed at this time. )  History of Present Illness: I had the pleasure of seeing Kristi Bowen for a follow up visit at the [facility] on 05/05/2022. She is a 37 y.o. female, who is being followed for allergic reaction with urticaria with angioedema. Her previous allergy office visit was on 04/07/2022 with Dr. Maudie Mercury. Today is a regular follow up visit.  Patient went to visit work for 10 minutes yesterday with no issues. She even went into the fridge section. Usually had breakouts within a few minutes of being at work in the past.  Currently taking zyrtec '10mg'$  once a day and famotidine '20mg'$  BID. Has Epipen on hand and benadryl if needed.  Has appointment with PCP next Tuesday.   Needs forms to be filled out for work.  Assessment and Plan: Kristi Bowen is a 37 y.o. female with: Urticaria with angioedema Past history - 2 episodes of pruritic rash/hives with 1 episode of facial swelling while at work. No triggers noted. Denies changes in diet, meds, personal are products or recent infections. 2023 skin prick testing showed: Negative to indoor/outdoor allergens and food panel.  Interim history - 2023 bloodwork unremarkable (CMP, TSH, ANA, CU, crp, tryptase, fire ant, alpha gal) except for elevated ESR and cbc had minor abnormalities. No other episodes even when visited workplace. Etiology unclear.  Continue proper skin care.  Continue zyrtec (cetirizine) '10mg'$  once a day and may take twice a day if needed.  OR you can also take allegra '180mg'$  1-2 times a day if needed.  If symptoms are not controlled or causes drowsiness let us know. Continue Pepcid (famotidine) '20mg'$  twice a day.  Avoid the following potential triggers: alcohol,  tight clothing, NSAIDs, hot showers and getting overheated. For mild symptoms you can take over the counter antihistamines such as Benadryl and monitor symptoms closely. If symptoms worsen or if you have severe symptoms including breathing issues, throat closure, significant swelling, whole body hives, severe diarrhea and vomiting, lightheadedness then seek immediate medical care. Emergency action plan in place. Get bloodwork - repeat CBC diff and ESR. Forms filled out for work - okay to return to work on 05/12/2022 without any restrictions. If she starts having issues again then will have to re-assess her duties at work.   Allergic reaction See assessment and plan as above.  Return in about 2 months (around 07/04/2022).  No orders of the defined types were placed in this encounter.  Lab Orders         CBC with Differential/Platelet         Sedimentation rate      Diagnostics: None.   Medication List:  Current Outpatient Medications  Medication Sig Dispense Refill   acetaminophen (TYLENOL) 500 MG tablet Take 1,000 mg by mouth every 6 (six) hours as needed for moderate pain.     amLODipine (NORVASC) 10 MG tablet Take 1 tablet (10 mg total) by mouth daily. 60 tablet 0   cetirizine (ZYRTEC) 10 MG tablet Take 1 tablet (10 mg total) by mouth daily. 30 tablet 0   clindamycin (CLEOCIN) 150 MG capsule Take 3 capsules (450 mg total) by mouth 3 (three) times daily. 90 capsule 0   EPINEPHrine 0.3 mg/0.3 mL IJ SOAJ injection Inject 0.3  mg into the muscle as needed for anaphylaxis. 2 each 1   famotidine (PEPCID) 20 MG tablet Take 1 tablet (20 mg total) by mouth 2 (two) times daily. 60 tablet 2   ibuprofen (ADVIL) 600 MG tablet Take 1 tablet (600 mg total) by mouth every 6 (six) hours as needed. 30 tablet 0   ibuprofen (ADVIL) 800 MG tablet Take 800 mg by mouth every 8 (eight) hours as needed.     triamcinolone cream (KENALOG) 0.1 % Apply 1 Application topically 2 (two) times daily. 30 g 0   No  current facility-administered medications for this visit.   Allergies: No Known Allergies I reviewed her past medical history, social history, family history, and environmental history and no significant changes have been reported from her previous visit.  Review of Systems  Constitutional:  Negative for appetite change, chills, fever and unexpected weight change.  HENT:  Negative for congestion and rhinorrhea.   Eyes:  Negative for itching.  Respiratory:  Negative for cough, chest tightness, shortness of breath and wheezing.   Cardiovascular:  Negative for chest pain.  Gastrointestinal:  Negative for abdominal pain.  Genitourinary:  Negative for difficulty urinating.  Skin:  Negative for rash.  Allergic/Immunologic: Negative for environmental allergies and food allergies.  Neurological:  Negative for headaches.    Objective: BP (!) 180/120   Pulse (!) 107   Temp 98.8 F (37.1 C) (Temporal)   Resp 12   Wt (!) 389 lb 12.8 oz (176.8 kg)   SpO2 97%   BMI 69.05 kg/m  Body mass index is 69.05 kg/m. Physical Exam Vitals and nursing note reviewed.  Constitutional:      Appearance: Normal appearance. She is well-developed. She is obese.  HENT:     Head: Normocephalic and atraumatic.     Right Ear: Tympanic membrane and external ear normal.     Left Ear: Tympanic membrane and external ear normal.     Nose: Nose normal.     Mouth/Throat:     Mouth: Mucous membranes are moist.     Pharynx: Oropharynx is clear.  Eyes:     Conjunctiva/sclera: Conjunctivae normal.  Cardiovascular:     Rate and Rhythm: Normal rate and regular rhythm.     Heart sounds: Normal heart sounds. No murmur heard.    No friction rub. No gallop.  Pulmonary:     Effort: Pulmonary effort is normal.     Breath sounds: Normal breath sounds. No wheezing, rhonchi or rales.  Musculoskeletal:     Cervical back: Neck supple.  Skin:    General: Skin is warm.     Findings: No rash.  Neurological:     Mental  Status: She is alert and oriented to person, place, and time.  Psychiatric:        Behavior: Behavior normal.    Previous notes and tests were reviewed. The plan was reviewed with the patient/family, and all questions/concerned were addressed.  It was my pleasure to see Keitra today and participate in her care. Please feel free to contact me with any questions or concerns.  Sincerely,  Rexene Alberts, DO Allergy & Immunology  [facility] office: [place] office: [phone]

## 2022-05-05 ENCOUNTER — Encounter: Payer: Self-pay | Admitting: Allergy

## 2022-05-05 ENCOUNTER — Ambulatory Visit: Payer: BC Managed Care – PPO | Admitting: Allergy

## 2022-05-05 ENCOUNTER — Other Ambulatory Visit: Payer: Self-pay

## 2022-05-05 VITALS — BP 180/120 | HR 107 | Temp 98.8°F | Resp 12 | Wt 389.8 lb

## 2022-05-05 DIAGNOSIS — L299 Pruritus, unspecified: Secondary | ICD-10-CM | POA: Diagnosis not present

## 2022-05-05 DIAGNOSIS — L509 Urticaria, unspecified: Secondary | ICD-10-CM

## 2022-05-05 DIAGNOSIS — T7840XD Allergy, unspecified, subsequent encounter: Secondary | ICD-10-CM | POA: Diagnosis not present

## 2022-05-05 NOTE — Assessment & Plan Note (Addendum)
Past history - 2 episodes of pruritic rash/hives with 1 episode of facial swelling while at work. No triggers noted. Denies changes in diet, meds, personal are products or recent infections. 2023 skin prick testing showed: Negative to indoor/outdoor allergens and food panel.  Interim history - 2023 bloodwork unremarkable (CMP, TSH, ANA, CU, crp, tryptase, fire ant, alpha gal) except for elevated ESR and cbc had minor abnormalities. No other episodes even when visited workplace. Etiology unclear.  Continue proper skin care.  Continue zyrtec (cetirizine) '10mg'$  once a day and may take twice a day if needed.  OR you can also take allegra '180mg'$  1-2 times a day if needed.  If symptoms are not controlled or causes drowsiness let us know. Continue Pepcid (famotidine) '20mg'$  twice a day.  Avoid the following potential triggers: alcohol, tight clothing, NSAIDs, hot showers and getting overheated. For mild symptoms you can take over the counter antihistamines such as Benadryl and monitor symptoms closely. If symptoms worsen or if you have severe symptoms including breathing issues, throat closure, significant swelling, whole body hives, severe diarrhea and vomiting, lightheadedness then seek immediate medical care. Emergency action plan in place. Get bloodwork - repeat CBC diff and ESR. Forms filled out for work - okay to return to work on 05/12/2022 without any restrictions. If she starts having issues again then will have to re-assess her duties at work.

## 2022-05-05 NOTE — Patient Instructions (Addendum)
Itching/hives/allergic reaction: Not sure what's causing this.  Continue proper skin care.  Continue zyrtec (cetirizine) '10mg'$  once a day and may take twice a day if needed.  OR you can also take allegra '180mg'$  1-2 times a day if needed.  If symptoms are not controlled or causes drowsiness let us know. Continue Pepcid (famotidine) '20mg'$  twice a day.  Avoid the following potential triggers: alcohol, tight clothing, NSAIDs, hot showers and getting overheated. For mild symptoms you can take over the counter antihistamines such as Benadryl and monitor symptoms closely. If symptoms worsen or if you have severe symptoms including breathing issues, throat closure, significant swelling, whole body hives, severe diarrhea and vomiting, lightheadedness then seek immediate medical care. Emergency action plan in place.  Get bloodwork We are ordering labs, so please allow 1-2 weeks for the results to come back. With the newly implemented Cures Act, the labs might be visible to you at the same time that they become visible to me. However, I will not address the results until all of the results are back, so please be patient.  In the meantime, continue recommendations in your patient instructions, including avoidance measures (if applicable), until you hear from me.  Follow up in 2 months or sooner if needed.    Follow up with PCP regarding your blood pressure.  Skin care recommendations  Bath time: Always use lukewarm water. AVOID very hot or cold water. Keep bathing time to 5-10 minutes. Do NOT use bubble bath. Use a mild soap and use just enough to wash the dirty areas. Do NOT scrub skin vigorously.  After bathing, pat dry your skin with a towel. Do NOT rub or scrub the skin.  Moisturizers and prescriptions:  ALWAYS apply moisturizers immediately after bathing (within 3 minutes). This helps to lock-in moisture. Use the moisturizer several times a day over the whole body. Good summer moisturizers  include: Aveeno, CeraVe, Cetaphil. Good winter moisturizers include: Aquaphor, Vaseline, Cerave, Cetaphil, Eucerin, Vanicream. When using moisturizers along with medications, the moisturizer should be applied about one hour after applying the medication to prevent diluting effect of the medication or moisturize around where you applied the medications. When not using medications, the moisturizer can be continued twice daily as maintenance.  Laundry and clothing: Avoid laundry products with added color or perfumes. Use unscented hypo-allergenic laundry products such as Tide free, Cheer free & gentle, and All free and clear.  If the skin still seems dry or sensitive, you can try double-rinsing the clothes. Avoid tight or scratchy clothing such as wool. Do not use fabric softeners or dyer sheets.

## 2022-05-06 LAB — CBC WITH DIFFERENTIAL/PLATELET
Basophils Absolute: 0 10*3/uL (ref 0.0–0.2)
Basos: 0 %
EOS (ABSOLUTE): 0 10*3/uL (ref 0.0–0.4)
Eos: 1 %
Hematocrit: 39.9 % (ref 34.0–46.6)
Hemoglobin: 12.4 g/dL (ref 11.1–15.9)
Immature Grans (Abs): 0 10*3/uL (ref 0.0–0.1)
Immature Granulocytes: 1 %
Lymphocytes Absolute: 1.5 10*3/uL (ref 0.7–3.1)
Lymphs: 20 %
MCH: 25.6 pg — ABNORMAL LOW (ref 26.6–33.0)
MCHC: 31.1 g/dL — ABNORMAL LOW (ref 31.5–35.7)
MCV: 82 fL (ref 79–97)
Monocytes Absolute: 0.6 10*3/uL (ref 0.1–0.9)
Monocytes: 8 %
Neutrophils Absolute: 5.5 10*3/uL (ref 1.4–7.0)
Neutrophils: 70 %
Platelets: 458 10*3/uL — ABNORMAL HIGH (ref 150–450)
RBC: 4.84 x10E6/uL (ref 3.77–5.28)
RDW: 14.4 % (ref 11.7–15.4)
WBC: 7.7 10*3/uL (ref 3.4–10.8)

## 2022-05-06 LAB — SEDIMENTATION RATE: Sed Rate: 38 mm/hr — ABNORMAL HIGH (ref 0–32)

## 2022-05-12 ENCOUNTER — Telehealth: Payer: Self-pay | Admitting: Allergy

## 2022-05-12 NOTE — Telephone Encounter (Signed)
Patient dropped off FMLA forms to be filled out, patient states Dr. Maudie Mercury filled out some forms at last visit 05-05-2022. She was not charged for forms filled out during visit.   Advised patient that we are charging for FMLA forms and it is a $25.00 fee. Advised patient that although she wasn't charged for forms at last visit, she may have to pay fee for additional forms. Advised patient paperwork would be ready in 3-5 business days and she would receive call for pick up. Patient verbalized understanding.   Spoke to Laqueta Carina stated patient would have to pay $25.00 FMLA Fee form before picking up paperwork.   FMLA Forms have been placed in nurse's station on [address] to be filled out.   Best contact number: [phone]

## 2022-05-12 NOTE — Telephone Encounter (Signed)
FMLA form has been put in Dr.Kim's inbox to complete.

## 2022-05-13 NOTE — Telephone Encounter (Signed)
Forms completed.  Please call patient and let her know that we will fax to her employer - [employer] [phone].  Thank you.

## 2022-05-13 NOTE — Telephone Encounter (Signed)
I called the patient and informed her that her FMLA form will be ready to pick up at the [employer] on 05/14/22. Patient is aware she will have to pay $25 at pickup.

## 2022-05-13 NOTE — Telephone Encounter (Signed)
Left vm to check mychart message regarding her fmla paperwork.

## 2022-05-14 DIAGNOSIS — J309 Allergic rhinitis, unspecified: Secondary | ICD-10-CM

## 2022-05-14 NOTE — Telephone Encounter (Signed)
Patient has picked up FMLA forms and paid $25.00 fee.

## 2022-05-27 ENCOUNTER — Ambulatory Visit
Admission: EM | Admit: 2022-05-27 | Discharge: 2022-05-27 | Disposition: A | Discharge status: Home / Self Care | Payer: BC Managed Care – PPO | Attending: Family Medicine | Admitting: Family Medicine

## 2022-05-27 DIAGNOSIS — H10021 Other mucopurulent conjunctivitis, right eye: Secondary | ICD-10-CM

## 2022-05-27 DIAGNOSIS — R030 Elevated blood-pressure reading, without diagnosis of hypertension: Secondary | ICD-10-CM

## 2022-05-27 MED ORDER — TOBRAMYCIN 0.3 % OP SOLN: 2.0000 [drp] | OPHTHALMIC | 0 refills | Status: AC

## 2022-05-27 NOTE — ED Triage Notes (Signed)
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

## 2022-05-27 NOTE — ED Provider Notes (Signed)
[facility]    CSN: [id number] Arrival date & time: 05/27/22  1528      History   Chief Complaint Chief Complaint  Patient presents with   Eye Problem    HPI Kristi Bowen is a 37 y.o. female.   Patient is here for possible pink eye.  She woke up this morning with right eye being red, irritated.  Light sensative.  A lot of yellow/green drainage this morning.  Has been draining all day.  She does wear contacts.  She does not sleep in them.  The right one is not in currently.  She was told today there is pink eye exposure at work.   Her bp is very elevated today.  She has been working on this with her PCP.  She was just started on a 3rd bp medication today by her dr.   She denies any headache, cp or sob otherwise.        History reviewed. No pertinent past medical history.  Patient Active Problem List   Diagnosis Date Noted   Urticaria with angioedema 04/07/2022   Allergic reaction 04/07/2022   Pruritus 04/07/2022   Elevated blood pressure reading 04/07/2022   Morbid obesity ([place]) 11/27/2018   Infertility, female 11/27/2018   Hemorrhagic ovarian cyst 11/27/2018   Dysmenorrhea 11/27/2018    History reviewed. No pertinent surgical history.  OB History   No obstetric history on file.      Home Medications    Prior to Admission medications   Medication Sig Start Date End Date Taking? Authorizing Provider  acetaminophen (TYLENOL) 500 MG tablet Take 1,000 mg by mouth every 6 (six) hours as needed for moderate pain.    [provider]  amLODipine (NORVASC) 10 MG tablet Take 1 tablet (10 mg total) by mouth daily. 03/15/22   Teodora Medici, FNP  cetirizine (ZYRTEC) 10 MG tablet Take 1 tablet (10 mg total) by mouth daily. 03/15/22   Teodora Medici, FNP  clindamycin (CLEOCIN) 150 MG capsule Take 3 capsules (450 mg total) by mouth 3 (three) times daily. 10/18/20   Muthersbaugh, Jarrett Soho, PA-C  EPINEPHrine 0.3 mg/0.3 mL IJ SOAJ injection Inject 0.3  mg into the muscle as needed for anaphylaxis. 04/07/22   Garnet Sierras, DO  famotidine (PEPCID) 20 MG tablet Take 1 tablet (20 mg total) by mouth 2 (two) times daily. 04/07/22   Garnet Sierras, DO  ibuprofen (ADVIL) 600 MG tablet Take 1 tablet (600 mg total) by mouth every 6 (six) hours as needed. 08/06/19   Charlann Lange, PA-C  ibuprofen (ADVIL) 800 MG tablet Take 800 mg by mouth every 8 (eight) hours as needed.    [provider]  triamcinolone cream (KENALOG) 0.1 % Apply 1 Application topically 2 (two) times daily. 03/15/22   Teodora Medici, FNP    Family History Family History  Problem Relation Age of Onset   Allergic rhinitis Mother    Allergic rhinitis Sister     Social History Social History   Tobacco Use   Smoking status: Former    Years: 10.00    Types: Cigarettes    Quit date: 2018    Years since quitting: 6.1    Passive exposure: Past   Smokeless tobacco: Never  Vaping Use   Vaping Use: Never used  Substance Use Topics   Alcohol use: No   Drug use: No     Allergies   Patient has no known allergies.   Review of Systems  Review of Systems  Constitutional: Negative.   HENT: Negative.    Eyes:  Positive for pain, discharge and redness.  Cardiovascular: Negative.   Gastrointestinal: Negative.   Musculoskeletal: Negative.   Psychiatric/Behavioral: Negative.       Physical Exam Triage Vital Signs ED Triage Vitals [05/27/22 1538]  Enc Vitals Group     BP (!) 205/115     Pulse Rate 66     Resp 18     Temp 98.2 F (36.8 C)     Temp Source Oral     SpO2 97 %     Weight      Height      Head Circumference      Peak Flow      Pain Score 6     Pain Loc      Pain Edu?      Excl. in [place]?    No data found.  Updated Vital Signs BP (!) 205/115 (BP Location: Left Arm) Comment: just started a new BP medication today  Pulse 66   Temp 98.2 F (36.8 C) (Oral)   Resp 18   LMP 05/06/2022   SpO2 97%   Visual Acuity Right Eye Distance:   Left Eye  Distance:   Bilateral Distance:    Right Eye Near:   Left Eye Near:    Bilateral Near:     Physical Exam Constitutional:      Appearance: Normal appearance.  Eyes:     General: Lids are normal.     Extraocular Movements: Extraocular movements intact.     Conjunctiva/sclera:     Right eye: Right conjunctiva is injected. Exudate and hemorrhage present.  Cardiovascular:     Rate and Rhythm: Normal rate and regular rhythm.  Pulmonary:     Effort: Pulmonary effort is normal.  Musculoskeletal:     Cervical back: Normal range of motion.  Skin:    General: Skin is warm.  Neurological:     General: No focal deficit present.     Mental Status: She is alert.  Psychiatric:        Mood and Affect: Mood normal.      UC Treatments / Results  Labs (all labs ordered are listed, but only abnormal results are displayed) Labs Reviewed - No data to display  EKG   Radiology No results found.  Procedures Procedures (including critical care time)  Medications Ordered in UC Medications - No data to display  Initial Impression / Assessment and Plan / UC Course  I have reviewed the triage vital signs and the nursing notes.  Pertinent labs & imaging results that were available during my care of the patient were reviewed by me and considered in my medical decision making (see chart for details).  Patient was treated today for pink eye.  Her bp was also very elevated.  This is a chronic issue for which she is seeing her pcp for.  Just started a 3rd medication today.  No other symptoms  just has headache, cp or sob.  She will contact her pcp for follow up in regards to this.     Final Clinical Impressions(s) / UC Diagnoses   Final diagnoses:  Pink eye disease of right eye  Elevated blood pressure reading     Discharge Instructions      You were seen today for pink eye.  I have sent out an eye drop for you today.  Please avoid touching the eye and wash hands frequently. If  you  develop symptoms in the other eye you may use the eye drops.  Please return if not improving or worsening.  As discussed please follow up with your primary care provider in regards to your blood pressure.     ED Prescriptions     Medication Sig Dispense Auth. Provider   tobramycin (TOBREX) 0.3 % ophthalmic solution Place 2 drops into the right eye every 4 (four) hours for 7 days. 5 mL Rondel Oh, MD      PDMP not reviewed this encounter.   Rondel Oh, MD 05/27/22 [phone]

## 2022-05-27 NOTE — Discharge Instructions (Addendum)
You were seen today for pink eye.  I have sent out an eye drop for you today.  Please avoid touching the eye and wash hands frequently. If you develop symptoms in the other eye you may use the eye drops.  Please return if not improving or worsening.  As discussed please follow up with your primary care provider in regards to your blood pressure.

## 2022-07-04 NOTE — Progress Notes (Unsigned)
Follow Up Note  RE: Kristi Bowen MRN: [id number] DOB: Sep 01, 1985 Date of Office Visit: 07/05/2022  Referring provider: Bartholome Bill, MD Primary care provider: Bartholome Bill, MD  Chief Complaint: Urticaria (No issues )  History of Present Illness: I had the pleasure of seeing Kristi Bowen for a follow up visit at the [facility] on 07/05/2022. She is a 37 y.o. female, who is being followed for urticaria with angioedema. Her previous allergy office visit was on 05/05/2022 with Dr. Maudie Mercury. Today is a regular follow up visit.  Urticaria with angioedema Patient is working full time at [employer] with no restrictions and no hives or swelling noted since she went back to work.  Currently taking zyrtec 10mg  once a day and famotidine 20mg  BID. She has not missed a dose. Has Epipen that is up to date.  Assessment and Plan: Kristi Bowen is a 37 y.o. female with: Urticaria with angioedema Past history - 2 episodes of pruritic rash/hives with 1 episode of facial swelling while at work. No triggers noted. Denies changes in diet, meds, personal are products or recent infections. 2023 skin prick testing showed: Negative to indoor/outdoor allergens and food panel. 2023 bloodwork unremarkable (CMP, TSH, ANA, CU, crp, tryptase, fire ant, alpha gal) except for elevated ESR and cbc had minor abnormalities. Interim history - 2024 repeat CBC diff and ESR unremarkable. No issues with being back at work.  Etiology unclear.  Continue proper skin care.   Step down schedule:  Decrease Pepcid to 20mg  once a day. Continue with zyrtec 10mg  once a day. If no hives/itching for 2 weeks then: Stop Pepcid. Continue with zyrtec 10mg  once a day. If no hives/itching for 2 weeks then: Take zyrtec 10mg  every other day. If no hives/itching for 2 weeks then: Stop zyrtec completely. If you get hives then go back to the dose where you didn't have any symptoms.   Avoid the following potential  triggers: alcohol, tight clothing, NSAIDs, hot showers and getting overheated. For mild symptoms you can take over the counter antihistamines such as Benadryl and monitor symptoms closely. If symptoms worsen or if you have severe symptoms including breathing issues, throat closure, significant swelling, whole body hives, severe diarrhea and vomiting, lightheadedness then seek immediate medical care. Emergency action plan in place. If you have no reactions for 1 year then okay not to renew Epipen.  Elevated blood pressure reading Follow up with PCP regarding your blood pressure.  Return if symptoms worsen or fail to improve.  No orders of the defined types were placed in this encounter.  Lab Orders  No laboratory test(s) ordered today    Diagnostics: None.   Medication List:  Current Outpatient Medications  Medication Sig Dispense Refill   acetaminophen (TYLENOL) 500 MG tablet Take 1,000 mg by mouth every 6 (six) hours as needed for moderate pain.     amLODipine (NORVASC) 10 MG tablet Take 1 tablet (10 mg total) by mouth daily. 60 tablet 0   cetirizine (ZYRTEC) 10 MG tablet Take 1 tablet (10 mg total) by mouth daily. 30 tablet 0   EPINEPHrine 0.3 mg/0.3 mL IJ SOAJ injection Inject 0.3 mg into the muscle as needed for anaphylaxis. 2 each 1   famotidine (PEPCID) 20 MG tablet Take 1 tablet (20 mg total) by mouth 2 (two) times daily. 60 tablet 2   ibuprofen (ADVIL) 800 MG tablet Take 800 mg by mouth every 8 (eight) hours as needed.     nebivolol (  BYSTOLIC) 5 MG tablet PLEASE SEE ATTACHED FOR DETAILED DIRECTIONS     triamcinolone cream (KENALOG) 0.1 % Apply 1 Application topically 2 (two) times daily. 30 g 0   No current facility-administered medications for this visit.   Allergies: No Known Allergies I reviewed her past medical history, social history, family history, and environmental history and no significant changes have been reported from her previous visit.  Review of Systems   Constitutional:  Negative for appetite change, chills, fever and unexpected weight change.  HENT:  Negative for congestion and rhinorrhea.   Eyes:  Negative for itching.  Respiratory:  Negative for cough, chest tightness, shortness of breath and wheezing.   Cardiovascular:  Negative for chest pain.  Gastrointestinal:  Negative for abdominal pain.  Genitourinary:  Negative for difficulty urinating.  Skin:  Negative for rash.  Allergic/Immunologic: Negative for environmental allergies and food allergies.  Neurological:  Negative for headaches.    Objective: BP (!) 142/84   Pulse 71   Temp 98 F (36.7 C)   Resp 18   Ht 5\' 3"  (1.6 m)   Wt (!) 402 lb 8 oz (182.6 kg)   SpO2 100%   BMI 71.30 kg/m  Body mass index is 71.3 kg/m. Physical Exam Vitals and nursing note reviewed.  Constitutional:      Appearance: Normal appearance. She is well-developed. She is obese.  HENT:     Head: Normocephalic and atraumatic.     Right Ear: Tympanic membrane and external ear normal.     Left Ear: Tympanic membrane and external ear normal.     Nose: Nose normal.     Mouth/Throat:     Mouth: Mucous membranes are moist.     Pharynx: Oropharynx is clear.  Eyes:     Conjunctiva/sclera: Conjunctivae normal.  Cardiovascular:     Rate and Rhythm: Normal rate and regular rhythm.     Heart sounds: Normal heart sounds. No murmur heard.    No friction rub. No gallop.  Pulmonary:     Effort: Pulmonary effort is normal.     Breath sounds: Normal breath sounds. No wheezing, rhonchi or rales.  Musculoskeletal:     Cervical back: Neck supple.  Skin:    General: Skin is warm.     Findings: No rash.  Neurological:     Mental Status: She is alert and oriented to person, place, and time.  Psychiatric:        Behavior: Behavior normal.    Previous notes and tests were reviewed. The plan was reviewed with the patient/family, and all questions/concerned were addressed.  It was my pleasure to see Kristi Bowen  today and participate in her care. Please feel free to contact me with any questions or concerns.  Sincerely,  Rexene Alberts, DO Allergy & Immunology  [facility] office: [place] office: [phone]

## 2022-07-05 ENCOUNTER — Ambulatory Visit: Payer: BC Managed Care – PPO | Admitting: Allergy

## 2022-07-05 ENCOUNTER — Other Ambulatory Visit: Payer: Self-pay

## 2022-07-05 ENCOUNTER — Encounter: Payer: Self-pay | Admitting: Allergy

## 2022-07-05 VITALS — BP 142/84 | HR 71 | Temp 98.0°F | Resp 18 | Ht 63.0 in | Wt >= 6400 oz

## 2022-07-05 DIAGNOSIS — R030 Elevated blood-pressure reading, without diagnosis of hypertension: Secondary | ICD-10-CM | POA: Diagnosis not present

## 2022-07-05 DIAGNOSIS — L509 Urticaria, unspecified: Secondary | ICD-10-CM

## 2022-07-05 DIAGNOSIS — T7840XD Allergy, unspecified, subsequent encounter: Secondary | ICD-10-CM | POA: Diagnosis not present

## 2022-07-05 NOTE — Assessment & Plan Note (Signed)
   Follow up with PCP regarding your blood pressure.  

## 2022-07-05 NOTE — Assessment & Plan Note (Signed)
Past history - 2 episodes of pruritic rash/hives with 1 episode of facial swelling while at work. No triggers noted. Denies changes in diet, meds, personal are products or recent infections. 2023 skin prick testing showed: Negative to indoor/outdoor allergens and food panel. 2023 bloodwork unremarkable (CMP, TSH, ANA, CU, crp, tryptase, fire ant, alpha gal) except for elevated ESR and cbc had minor abnormalities. Interim history - 2024 repeat CBC diff and ESR unremarkable. No issues with being back at work.  Etiology unclear.  Continue proper skin care.   Step down schedule:  Decrease Pepcid to 20mg  once a day. Continue with zyrtec 10mg  once a day. If no hives/itching for 2 weeks then: Stop Pepcid. Continue with zyrtec 10mg  once a day. If no hives/itching for 2 weeks then: Take zyrtec 10mg  every other day. If no hives/itching for 2 weeks then: Stop zyrtec completely. If you get hives then go back to the dose where you didn't have any symptoms.   Avoid the following potential triggers: alcohol, tight clothing, NSAIDs, hot showers and getting overheated. For mild symptoms you can take over the counter antihistamines such as Benadryl and monitor symptoms closely. If symptoms worsen or if you have severe symptoms including breathing issues, throat closure, significant swelling, whole body hives, severe diarrhea and vomiting, lightheadedness then seek immediate medical care. Emergency action plan in place. If you have no reactions for 1 year then okay not to renew Epipen.

## 2022-07-05 NOTE — Patient Instructions (Addendum)
Itching/hives/allergic reaction: Not sure what's causing this.  Continue proper skin care.   Step down schedule:  Decrease Pepcid to 20mg  once a day. Continue with zyrtec 10mg  once a day. If no hives/itching for 2 weeks then: Stop Pepcid. Continue with zyrtec 10mg  once a day. If no hives/itching for 2 weeks then: Take zyrtec 10mg  every other day. If no hives/itching for 2 weeks then: Stop zyrtec completely. If you get hives then go back to the dose where you didn't have any symptoms.   Avoid the following potential triggers: alcohol, tight clothing, NSAIDs, hot showers and getting overheated. For mild symptoms you can take over the counter antihistamines such as Benadryl and monitor symptoms closely. If symptoms worsen or if you have severe symptoms including breathing issues, throat closure, significant swelling, whole body hives, severe diarrhea and vomiting, lightheadedness then seek immediate medical care. Emergency action plan in place.  If you have no reactions for 1 year then okay not to renew your Epipen.  Follow up if needed.   Follow up with PCP regarding your blood pressure.

## 2023-05-31 ENCOUNTER — Encounter: Payer: Self-pay | Admitting: Podiatry

## 2023-05-31 ENCOUNTER — Ambulatory Visit: Payer: Managed Care, Other (non HMO) | Admitting: Podiatry

## 2023-05-31 DIAGNOSIS — B351 Tinea unguium: Secondary | ICD-10-CM

## 2023-05-31 DIAGNOSIS — M79674 Pain in right toe(s): Secondary | ICD-10-CM

## 2023-05-31 DIAGNOSIS — M79675 Pain in left toe(s): Secondary | ICD-10-CM

## 2023-05-31 NOTE — Progress Notes (Unsigned)
    Subjective:  Patient ID: Kristi Bowen, female    DOB: 1985/12/11,  MRN: [id number]   Kristi Bowen presents to clinic today for:  Chief Complaint  Patient presents with   Nail Problem    Patient is here for bilateral nail fungus   Patient notes nails are thick, discolored, elongated and painful in shoegear when trying to ambulate.  She would like to start treating the nails.  She states that she has had this for several years but has finally decided to seek out formal treatment for this.  She has used some over-the-counter treatments which were unsuccessful.  PCP is Verlon Au, MD.  Past Medical History:  Diagnosis Date   Urticaria     History reviewed. No pertinent surgical history.  No Known Allergies  Review of Systems: Negative except as noted in the HPI.  Objective:  Kristi Bowen is a pleasant 38 y.o. female in NAD. AAO x 3.  Vascular Examination: Capillary refill time is 3-5 seconds to toes bilateral. Palpable pedal pulses b/l LE. Digital hair present b/l.  Skin temperature gradient WNL b/l. No varicosities b/l. No cyanosis noted b/l.   Dermatological Examination: Pedal skin with normal turgor, texture and tone b/l. No open wounds. No interdigital macerations b/l. Toenails x10 are 3mm thick, discolored, dystrophic with subungual debris. There is pain with compression of the nail plates.  They are elongated x10  Assessment/Plan: 1. Pain due to onychomycosis of toenails of both feet   2. Onychomycosis    The mycotic toenails were sharply debrided x10 with sterile nail nippers and a power debriding burr to decrease bulk/thickness and length.    Clippings of nails 1, 2, and 3 were collected and [facility] fungal nail culture.  Also placed an order for a hepatic function panel to be performed.  The patient is open to starting oral treatment for the fungus in the nails.    Informed patient that sometimes the insurance will  require a positive fungal culture before starting oral medication we also need to make sure there is no underlying liver disease before starting on the oral medication.  She understood.  Return in about 4 months (around 09/28/2023) for fungal nail recheck.   Clerance Lav, DPM, FACFAS [facility]     [address] [place], [place] [address]                Office [phone]  Fax [phone]

## 2023-06-01 ENCOUNTER — Encounter: Payer: Self-pay | Admitting: Podiatry

## 2023-06-01 LAB — HEPATIC FUNCTION PANEL
ALT: 12 [IU]/L (ref 0–32)
AST: 14 [IU]/L (ref 0–40)
Albumin: 4 g/dL (ref 3.9–4.9)
Alkaline Phosphatase: 42 [IU]/L — ABNORMAL LOW (ref 44–121)
Bilirubin Total: 0.3 mg/dL (ref 0.0–1.2)
Bilirubin, Direct: 0.12 mg/dL (ref 0.00–0.40)
Total Protein: 7 g/dL (ref 6.0–8.5)

## 2023-06-13 ENCOUNTER — Other Ambulatory Visit: Payer: Self-pay | Admitting: Podiatry

## 2023-07-22 ENCOUNTER — Encounter: Payer: Self-pay | Admitting: Podiatry

## 2023-07-26 ENCOUNTER — Other Ambulatory Visit: Payer: Self-pay | Admitting: Podiatry

## 2023-07-26 MED ORDER — TERBINAFINE HCL 250 MG PO TABS: 250.0000 mg | ORAL_TABLET | Freq: Every day | ORAL | 0 refills | Status: AC

## 2023-10-03 ENCOUNTER — Encounter: Payer: BC Managed Care – PPO | Admitting: Podiatry

## 2023-10-03 NOTE — Progress Notes (Signed)
Patient did not show for scheduled appointment today.
